# Patient Record
Sex: Male | Born: 1935 | Race: White | Hispanic: No | Marital: Married | Smoking: Former smoker
Health system: Southern US, Community
[De-identification: ages and names within clinical notes are randomized; demographics above are authoritative.]

## PROBLEM LIST (undated history)

## (undated) DIAGNOSIS — C3490 Malignant neoplasm of unspecified part of unspecified bronchus or lung: Secondary | ICD-10-CM

## (undated) DIAGNOSIS — E119 Type 2 diabetes mellitus without complications: Secondary | ICD-10-CM

## (undated) DIAGNOSIS — I10 Essential (primary) hypertension: Secondary | ICD-10-CM

## (undated) HISTORY — PX: LUNG LOBECTOMY: SHX167

---

## 2005-10-28 ENCOUNTER — Ambulatory Visit: Payer: Self-pay | Admitting: Gastroenterology

## 2008-12-20 ENCOUNTER — Ambulatory Visit: Payer: Self-pay | Admitting: Oncology

## 2009-01-15 ENCOUNTER — Ambulatory Visit: Payer: Self-pay | Admitting: Internal Medicine

## 2009-01-18 ENCOUNTER — Ambulatory Visit: Payer: Self-pay | Admitting: Oncology

## 2009-01-20 ENCOUNTER — Ambulatory Visit: Payer: Self-pay | Admitting: Oncology

## 2009-01-23 ENCOUNTER — Ambulatory Visit: Payer: Self-pay | Admitting: Oncology

## 2009-01-24 ENCOUNTER — Ambulatory Visit: Payer: Self-pay | Admitting: Oncology

## 2009-01-29 ENCOUNTER — Ambulatory Visit: Payer: Self-pay | Admitting: General Surgery

## 2009-01-31 ENCOUNTER — Inpatient Hospital Stay: Payer: Self-pay | Admitting: General Surgery

## 2009-02-16 ENCOUNTER — Ambulatory Visit: Payer: Self-pay | Admitting: General Surgery

## 2009-02-20 ENCOUNTER — Ambulatory Visit: Payer: Self-pay | Admitting: Oncology

## 2009-03-20 ENCOUNTER — Ambulatory Visit: Payer: Self-pay | Admitting: Oncology

## 2009-03-26 ENCOUNTER — Ambulatory Visit: Payer: Self-pay | Admitting: General Surgery

## 2009-04-20 ENCOUNTER — Ambulatory Visit: Payer: Self-pay | Admitting: Oncology

## 2009-04-24 ENCOUNTER — Ambulatory Visit: Payer: Self-pay | Admitting: Oncology

## 2009-04-26 ENCOUNTER — Ambulatory Visit: Payer: Self-pay | Admitting: Oncology

## 2009-05-20 ENCOUNTER — Ambulatory Visit: Payer: Self-pay | Admitting: Oncology

## 2009-08-20 ENCOUNTER — Ambulatory Visit: Payer: Self-pay | Admitting: Oncology

## 2009-08-27 ENCOUNTER — Ambulatory Visit: Payer: Self-pay | Admitting: Oncology

## 2009-08-29 ENCOUNTER — Ambulatory Visit: Payer: Self-pay | Admitting: Oncology

## 2009-09-20 ENCOUNTER — Ambulatory Visit: Payer: Self-pay | Admitting: Oncology

## 2009-12-24 ENCOUNTER — Ambulatory Visit: Payer: Self-pay | Admitting: Oncology

## 2009-12-26 ENCOUNTER — Ambulatory Visit: Payer: Self-pay | Admitting: Oncology

## 2010-01-20 ENCOUNTER — Ambulatory Visit: Payer: Self-pay | Admitting: Oncology

## 2010-06-25 ENCOUNTER — Ambulatory Visit: Payer: Self-pay | Admitting: Oncology

## 2010-06-27 ENCOUNTER — Ambulatory Visit: Payer: Self-pay | Admitting: Oncology

## 2010-07-21 ENCOUNTER — Ambulatory Visit: Payer: Self-pay | Admitting: Oncology

## 2011-01-03 IMAGING — CT CT CHEST W/ CM
1 series · 15 of 32 positions shown, 19 images · IV contrast (agent unspecified)
Comparison: 01/15/2009, 04/24/2009, 08/27/2009

REASON FOR EXAM: restaging lung CA
COMMENTS:

PROCEDURE:     CT  - CT CHEST WITH CONTRAST  - December 24, 2009  [DATE]
RESULT:     Indication: Restaging lung cancer
TECHNIQUE: Multiple axial images of the chest are obtained with 75 mL of
Gsovue-190 intravenous contrast.

[Series 2: soft tissue · axial · 0.71mm/px · z∈[-606,-326]mm · 15 of 64 slices shown, 19 images]
[im 5/64  soft-tissue]
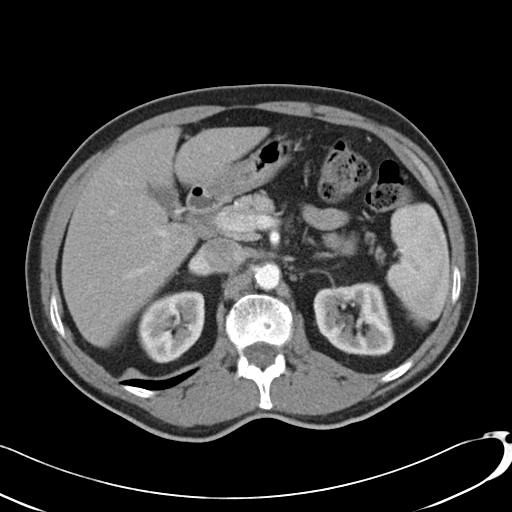
[im 5/64  bone]
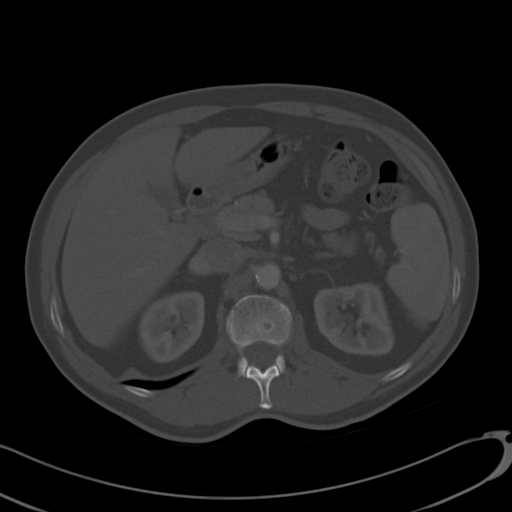
[im 9/64  soft-tissue]
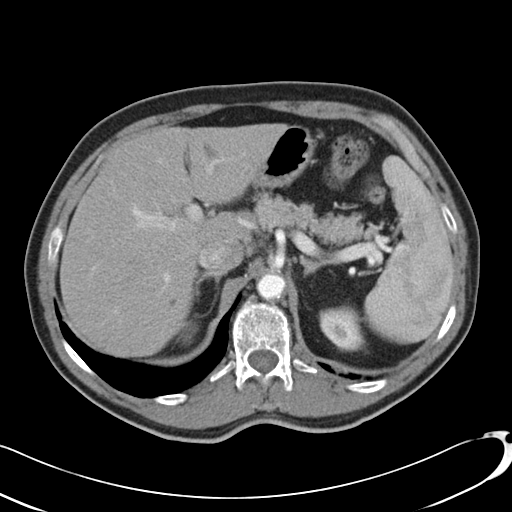
[im 13/64  soft-tissue]
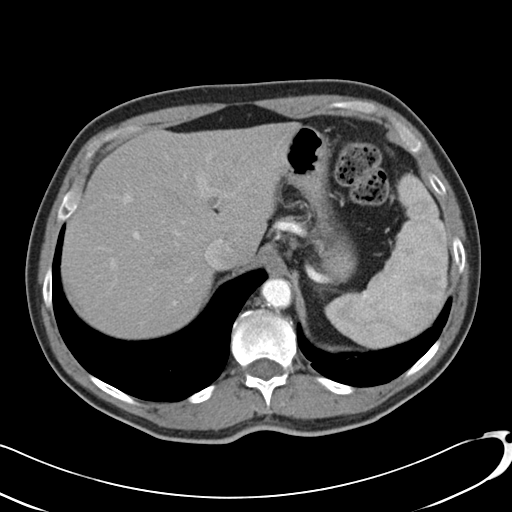
[im 19/64  soft-tissue]
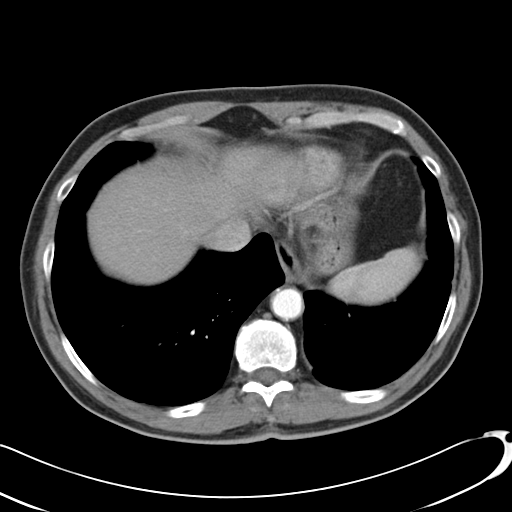
[im 23/64  soft-tissue]
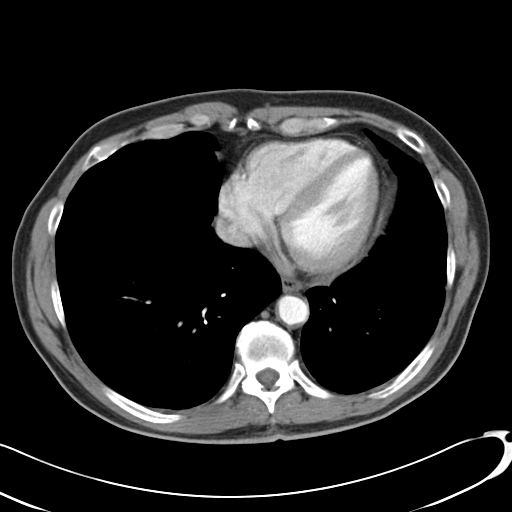
[im 27/64  soft-tissue]
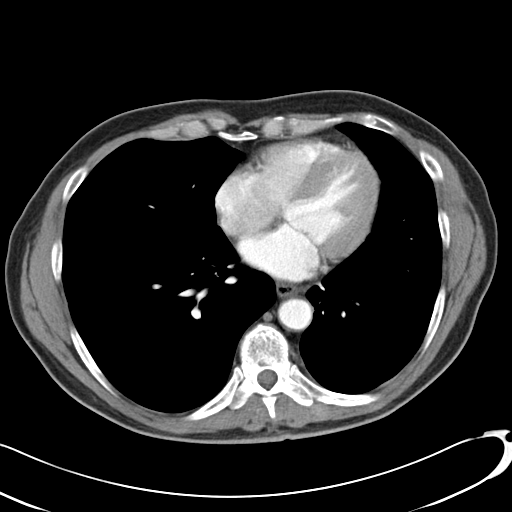
[im 33/64  soft-tissue]
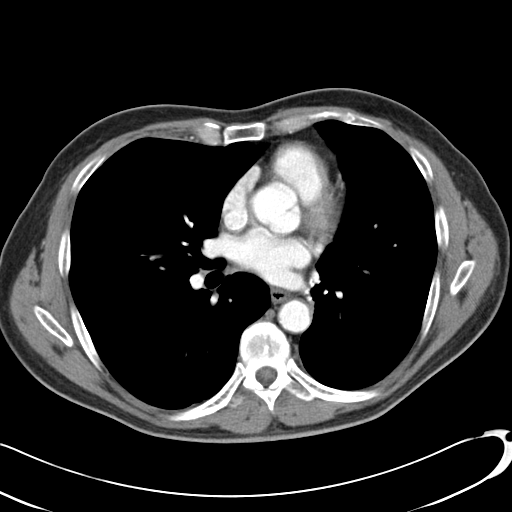
[im 37/64  soft-tissue]
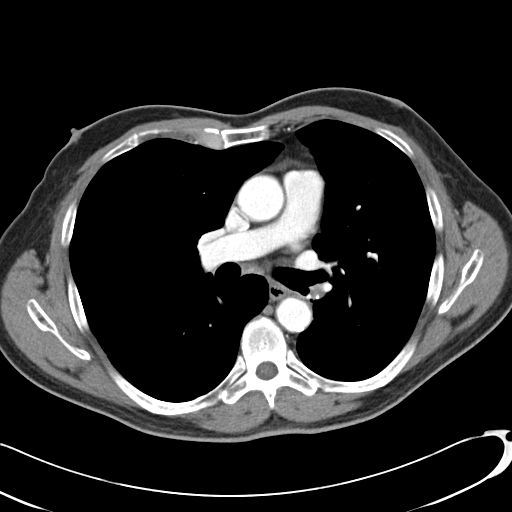
[im 41/64  soft-tissue]
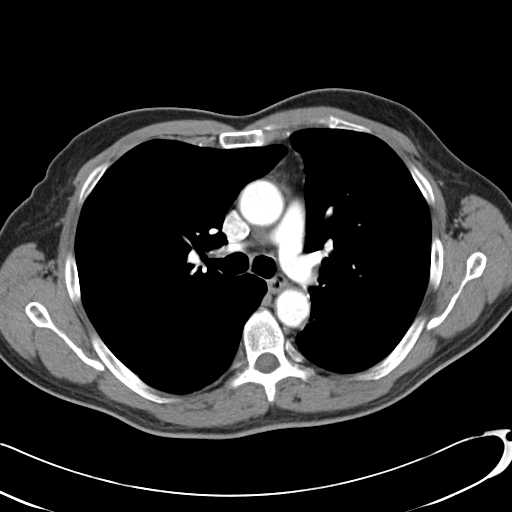
[im 41/64  bone]
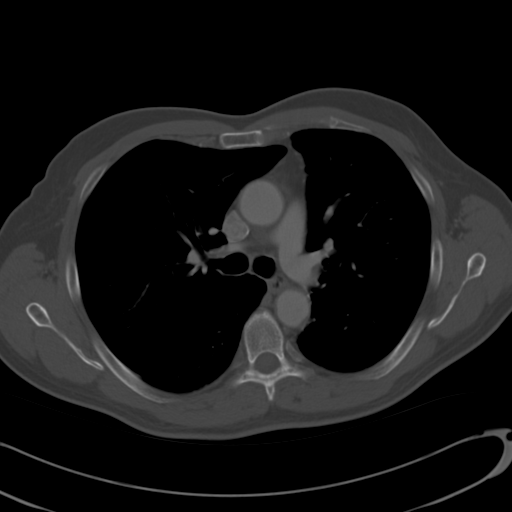
[im 45/64  soft-tissue]
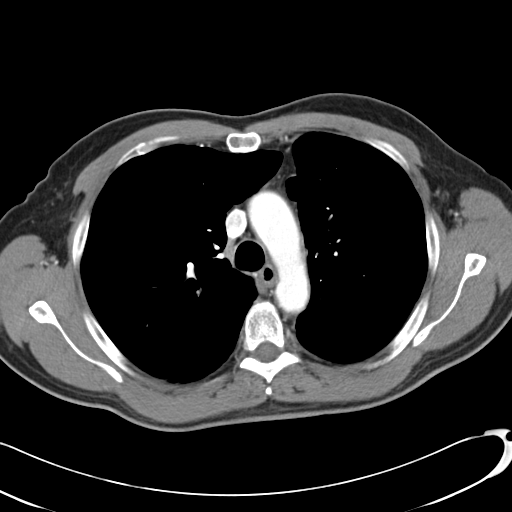
[im 51/64  soft-tissue]
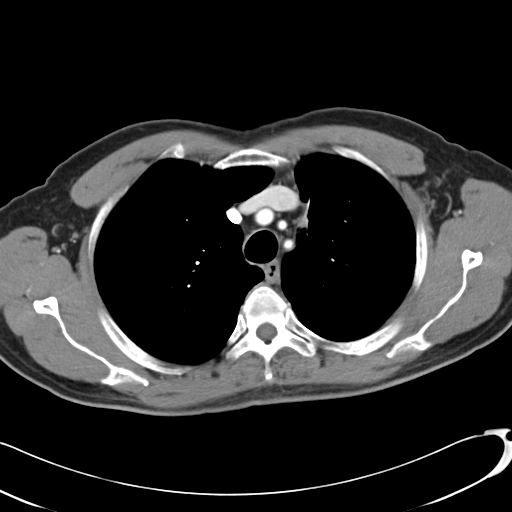
[im 55/64  soft-tissue]
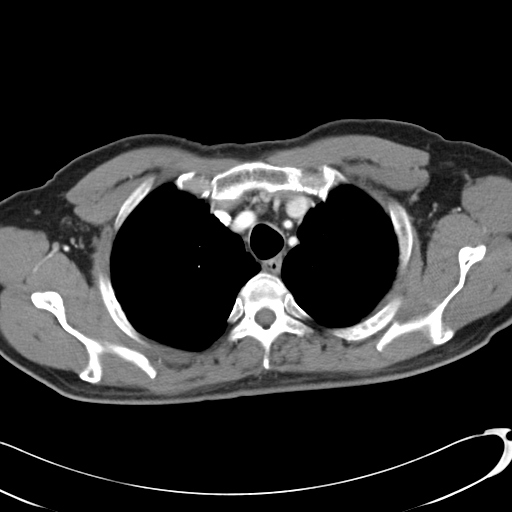
[im 55/64  lung]
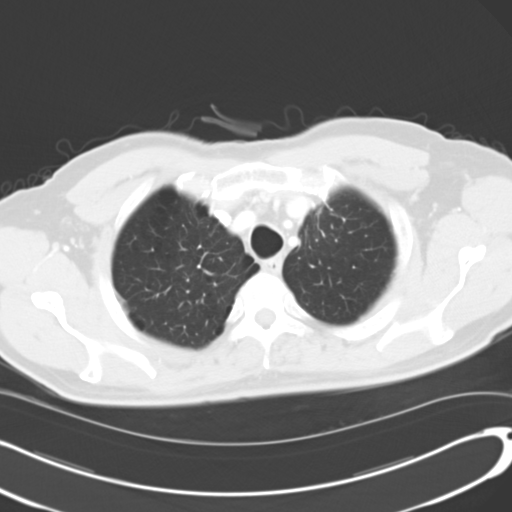
[im 57/64  lung]
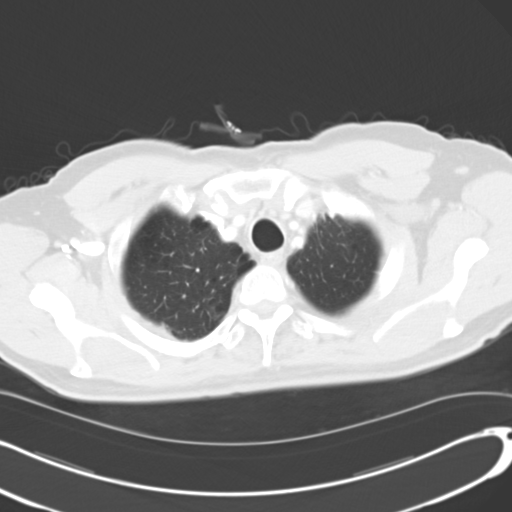
[im 59/64  soft-tissue]
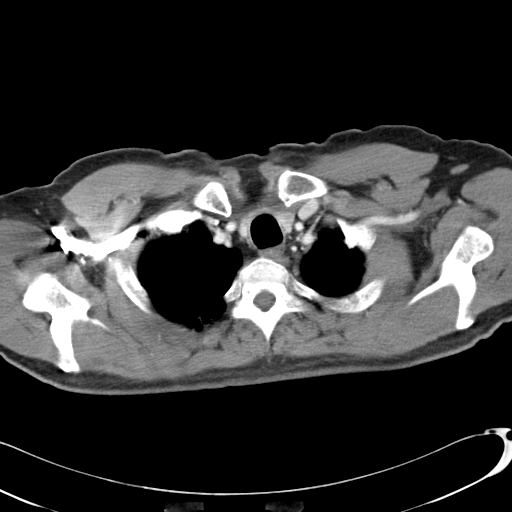
[im 59/64  lung]
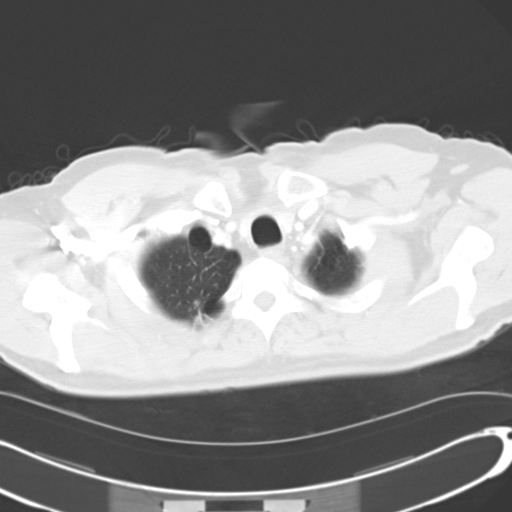
[im 61/64  lung]
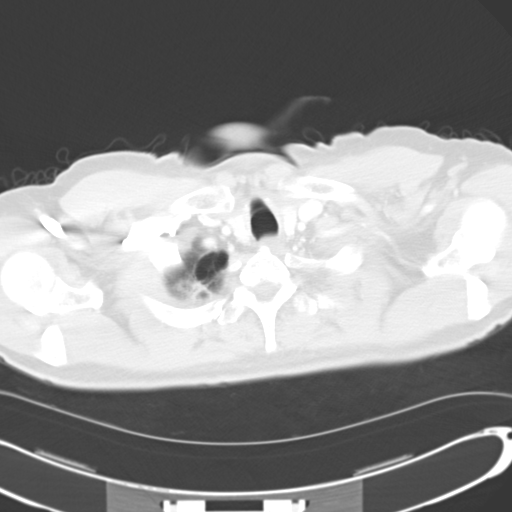

[15 of 32 positions shown; findings below may reference images not displayed]

FINDINGS: The central airways are patent. There are post surgical changes in the upper
medial left lobe. The previously demonstrated mass in the left lower lobe it
is no longer appreciated. There is no new pulmonary nodule or pulmonary
mass. There is no pleural effusion or pneumothorax.

There are no pathologically enlarged axillary, hilar, or mediastinal lymph
nodes.

The heart size is normal. There is no pericardial effusion. The thoracic
aorta is normal in caliber.

Review of bone windows demonstrates no focal lytic or sclerotic lesions.

Limited noncontrast images of the upper abdomen were obtained. The adrenal
glands appear normal.  There multiple hepatic hypodensities which are
incompletely characterize but unchanged compared with 04/24/2009..
IMPRESSION: 1. No evidence of recurrent or residual malignancy.

## 2011-01-23 ENCOUNTER — Ambulatory Visit: Payer: Self-pay | Admitting: Oncology

## 2011-01-27 ENCOUNTER — Ambulatory Visit: Payer: Self-pay | Admitting: Oncology

## 2011-01-30 ENCOUNTER — Ambulatory Visit: Payer: Self-pay | Admitting: Oncology

## 2011-01-31 LAB — CREATININE, SERUM
Creatinine: 0.96 mg/dL (ref 0.60–1.30)
EGFR (African American): >60
EGFR (Non-African Amer.): >60

## 2011-02-21 ENCOUNTER — Ambulatory Visit: Payer: Self-pay | Admitting: Oncology

## 2013-12-24 ENCOUNTER — Inpatient Hospital Stay: Payer: Self-pay | Admitting: Internal Medicine

## 2013-12-24 LAB — COMPREHENSIVE METABOLIC PANEL
ALK PHOS: 91 U/L
Albumin: 3.5 g/dL (ref 3.4–5.0)
Anion Gap: 10 (ref 7–16)
BUN: 16 mg/dL (ref 7–18)
Bilirubin,Total: 0.9 mg/dL (ref 0.2–1.0)
CO2: 28 mmol/L (ref 21–32)
CREATININE: 1.12 mg/dL (ref 0.60–1.30)
Calcium, Total: 8.6 mg/dL (ref 8.5–10.1)
Chloride: 100 mmol/L (ref 98–107)
EGFR (African American): >60
Glucose: 194 mg/dL — ABNORMAL HIGH (ref 65–99)
Osmolality: 282 (ref 275–301)
POTASSIUM: 4 mmol/L (ref 3.5–5.1)
SGOT(AST): 15 U/L (ref 15–37)
SGPT (ALT): 18 U/L
Sodium: 138 mmol/L (ref 136–145)
TOTAL PROTEIN: 6.8 g/dL (ref 6.4–8.2)

## 2013-12-24 LAB — CBC
HCT: 39.2 % — ABNORMAL LOW (ref 40.0–52.0)
HGB: 12.3 g/dL — AB (ref 13.0–18.0)
MCH: 27.2 pg (ref 26.0–34.0)
MCHC: 31.4 g/dL — ABNORMAL LOW (ref 32.0–36.0)
MCV: 86 fL (ref 80–100)
Platelet: 270 10*3/uL (ref 150–440)
RBC: 4.54 10*6/uL (ref 4.40–5.90)
RDW: 15 % — ABNORMAL HIGH (ref 11.5–14.5)
WBC: 25.1 10*3/uL — ABNORMAL HIGH (ref 3.8–10.6)

## 2013-12-24 LAB — URINALYSIS, COMPLETE
BILIRUBIN, UR: NEGATIVE
BLOOD: NEGATIVE
Glucose,UR: NEGATIVE mg/dL (ref 0–75)
Ketone: NEGATIVE
Leukocyte Esterase: NEGATIVE
Nitrite: NEGATIVE
Ph: 5 (ref 4.5–8.0)
Protein: 30
RBC,UR: NONE SEEN /HPF (ref 0–5)
SPECIFIC GRAVITY: 1.019 (ref 1.003–1.030)
Squamous Epithelial: NONE SEEN
WBC UR: 4 /HPF (ref 0–5)

## 2013-12-24 LAB — D-DIMER(ARMC): D-DIMER: 2328 ng/mL

## 2013-12-24 LAB — TROPONIN I: Troponin-I: <0.02 ng/mL

## 2013-12-25 LAB — CBC WITH DIFFERENTIAL/PLATELET
BASOS ABS: 0 10*3/uL (ref 0.0–0.1)
BASOS PCT: 0.1 %
Eosinophil #: 0 10*3/uL (ref 0.0–0.7)
Eosinophil %: 0.1 %
HCT: 31 % — AB (ref 40.0–52.0)
HGB: 9.8 g/dL — ABNORMAL LOW (ref 13.0–18.0)
LYMPHS PCT: 5.1 %
Lymphocyte #: 0.8 10*3/uL — ABNORMAL LOW (ref 1.0–3.6)
MCH: 27.5 pg (ref 26.0–34.0)
MCHC: 31.7 g/dL — ABNORMAL LOW (ref 32.0–36.0)
MCV: 87 fL (ref 80–100)
MONOS PCT: 5.5 %
Monocyte #: 0.8 x10 3/mm (ref 0.2–1.0)
NEUTROS ABS: 13.4 10*3/uL — AB (ref 1.4–6.5)
Neutrophil %: 89.2 %
Platelet: 192 10*3/uL (ref 150–440)
RBC: 3.56 10*6/uL — ABNORMAL LOW (ref 4.40–5.90)
RDW: 15.1 % — ABNORMAL HIGH (ref 11.5–14.5)
WBC: 15 10*3/uL — ABNORMAL HIGH (ref 3.8–10.6)

## 2013-12-25 LAB — BASIC METABOLIC PANEL
ANION GAP: 9 (ref 7–16)
BUN: 13 mg/dL (ref 7–18)
CALCIUM: 7.6 mg/dL — AB (ref 8.5–10.1)
CO2: 26 mmol/L (ref 21–32)
Chloride: 108 mmol/L — ABNORMAL HIGH (ref 98–107)
Creatinine: 0.75 mg/dL (ref 0.60–1.30)
EGFR (Non-African Amer.): >60
Glucose: 47 mg/dL — ABNORMAL LOW (ref 65–99)
OSMOLALITY: 282 (ref 275–301)
Potassium: 3.3 mmol/L — ABNORMAL LOW (ref 3.5–5.1)
Sodium: 143 mmol/L (ref 136–145)

## 2013-12-26 LAB — CBC WITH DIFFERENTIAL/PLATELET
Basophil #: 0 10*3/uL (ref 0.0–0.1)
Basophil %: 0.2 %
Eosinophil #: 0.1 10*3/uL (ref 0.0–0.7)
Eosinophil %: 1.5 %
HCT: 29.1 % — AB (ref 40.0–52.0)
HGB: 9.5 g/dL — ABNORMAL LOW (ref 13.0–18.0)
LYMPHS ABS: 0.5 10*3/uL — AB (ref 1.0–3.6)
Lymphocyte %: 5 %
MCH: 28 pg (ref 26.0–34.0)
MCHC: 32.4 g/dL (ref 32.0–36.0)
MCV: 86 fL (ref 80–100)
Monocyte #: 0.6 x10 3/mm (ref 0.2–1.0)
Monocyte %: 5.8 %
Neutrophil #: 8.7 10*3/uL — ABNORMAL HIGH (ref 1.4–6.5)
Neutrophil %: 87.5 %
Platelet: 200 10*3/uL (ref 150–440)
RBC: 3.37 10*6/uL — ABNORMAL LOW (ref 4.40–5.90)
RDW: 15.1 % — ABNORMAL HIGH (ref 11.5–14.5)
WBC: 9.9 10*3/uL (ref 3.8–10.6)

## 2013-12-26 LAB — BASIC METABOLIC PANEL
ANION GAP: 9 (ref 7–16)
BUN: 12 mg/dL (ref 7–18)
Calcium, Total: 7.9 mg/dL — ABNORMAL LOW (ref 8.5–10.1)
Chloride: 110 mmol/L — ABNORMAL HIGH (ref 98–107)
Co2: 25 mmol/L (ref 21–32)
Creatinine: 0.75 mg/dL (ref 0.60–1.30)
EGFR (African American): >60
EGFR (Non-African Amer.): >60
GLUCOSE: 177 mg/dL — AB (ref 65–99)
OSMOLALITY: 291 (ref 275–301)
Potassium: 3.6 mmol/L (ref 3.5–5.1)
SODIUM: 144 mmol/L (ref 136–145)

## 2013-12-27 LAB — CBC WITH DIFFERENTIAL/PLATELET
BASOS ABS: 0 10*3/uL (ref 0.0–0.1)
Basophil %: 0.3 %
Eosinophil #: 0.3 10*3/uL (ref 0.0–0.7)
Eosinophil %: 3.4 %
HCT: 32.4 % — AB (ref 40.0–52.0)
HGB: 10.6 g/dL — AB (ref 13.0–18.0)
LYMPHS ABS: 0.6 10*3/uL — AB (ref 1.0–3.6)
Lymphocyte %: 7.5 %
MCH: 27.9 pg (ref 26.0–34.0)
MCHC: 32.6 g/dL (ref 32.0–36.0)
MCV: 86 fL (ref 80–100)
Monocyte #: 0.6 x10 3/mm (ref 0.2–1.0)
Monocyte %: 7.3 %
NEUTROS ABS: 6.1 10*3/uL (ref 1.4–6.5)
Neutrophil %: 81.5 %
Platelet: 244 10*3/uL (ref 150–440)
RBC: 3.79 10*6/uL — ABNORMAL LOW (ref 4.40–5.90)
RDW: 15.1 % — ABNORMAL HIGH (ref 11.5–14.5)
WBC: 7.5 10*3/uL (ref 3.8–10.6)

## 2013-12-27 LAB — BASIC METABOLIC PANEL
ANION GAP: 7 (ref 7–16)
BUN: 10 mg/dL (ref 7–18)
CREATININE: 0.71 mg/dL (ref 0.60–1.30)
Calcium, Total: 8.5 mg/dL (ref 8.5–10.1)
Chloride: 112 mmol/L — ABNORMAL HIGH (ref 98–107)
Co2: 25 mmol/L (ref 21–32)
EGFR (African American): >60
EGFR (Non-African Amer.): >60
Glucose: 120 mg/dL — ABNORMAL HIGH (ref 65–99)
Osmolality: 287 (ref 275–301)
Potassium: 3.9 mmol/L (ref 3.5–5.1)
SODIUM: 144 mmol/L (ref 136–145)

## 2013-12-29 LAB — CULTURE, BLOOD (SINGLE)

## 2014-05-13 NOTE — H&P (Signed)
PATIENT NAME:  Justin Wade, Justin Wade MR#:  [id number] DATE OF BIRTH:  [birth year]  DATE OF ADMISSION:  12/24/2013  PRIMARY CARE PHYSICIAN: Leonie Douglas. Doy Hutching, MD  ONCOLOGIST: Gaspar Cola   CHIEF COMPLAINT: Dizziness and weakness.   HISTORY OF PRESENT ILLNESS: This is a 81 year old male who presents to the hospital complaining of dizziness and weakness that began last night when he attempted to get up in the middle of night. The patient says that he has also been feeling increasingly weak now for the past few days. He has a history of lung cancer, but has not had any chemotherapy in the past 3-4 weeks. He presented to the hospital as he was feeling quite weak and dizzy. He was noted to have a leukocytosis. The patient was also noted to be tachycardic in the 120s-130s. He underwent a CT scan of his chest, which did not show any evidence of pulmonary embolism, but did show left lower lobe pneumonia. The patient did have leukocytosis.   Hospitalist services were contacted for further treatment and evaluation.   The patient does admit to a cough, which is productive with yellow sputum, but no hemoptysis. He denies any shortness of breath, any nausea, any vomiting, any abdominal pain. He does admit to occasional diarrhea, but no other associated symptoms presently.   REVIEW OF SYSTEMS:  CONSTITUTIONAL: No documented fever. Positive fatigue and weakness. No weight gain or weight loss.  EYES: No blurred or double vision.  EARS, NOSE, AND THROAT No tinnitus or postnasal drip. No redness of the oropharynx.  RESPIRATORY: Positive cough. No wheeze or hemoptysis. Positive COPD.  CARDIOVASCULAR: No chest pain. No orthopnea. No palpitation or syncope.  GASTROINTESTINAL: No nausea, no vomiting, no diarrhea. No abdominal pain. No melena or hematochezia.  GENITOURINARY: No dysuria or hematuria.  ENDOCRINE: No polyuria or nocturia. No heat or cold intolerance. HEMATOLOGIC: No anemia, no bruising, no bleeding.   INTEGUMENTARY: No rashes or lesions.  MUSCULOSKELETAL: No arthritis. No swelling. No gout.  NEUROLOGIC: No numbness or tingling. No ataxia. No seizure activity.  PSYCHIATRIC: No anxiety or insomnia. No ADD.   PAST MEDICAL HISTORY: Consistent with diabetes, hypertension, history of lung cancer, GERD, hyperlipidemia.   ALLERGIES: No known drug allergies.   SOCIAL HISTORY: Used to be a smoker, quit 30+ years ago. Occasional alcohol use. No illicit drug abuse. Lives with his wife.   FAMILY HISTORY: The patient's father died from complications of diabetes. Mother died from unknown type of cancer.   CURRENT MEDICATIONS: As follows: Atorvastatin 20 mg at bedtime, Tussionex 5 mL at bedtime, dexamethasone 1 tablet b.i.d. on the days before chemotherapy, glimepiride 4 mg b.i.d., hydrocortisone 2.5 mg daily, Imdur 60 mg daily, Janumet 500/50 mg 1 tablet b.i.d., Mucinex daily as needed, multivitamin daily, and omeprazole 40 mg daily.   PHYSICAL EXAMINATION: Presently is as follows:  VITAL SIGNS: Temperature 98.6, pulse 132, respirations 24, blood pressure 100/71, saturations 96% on room air.  GENERAL: The patient is a pleasant-appearing male in no apparent distress.  HEAD, EYES, EARS, NOSE AND THROAT: Atraumatic, normocephalic. Extraocular muscles are intact. Pupils react to light. Sclerae anicteric. No conjunctival injection. No pharyngeal erythema.  NECK: Supple. There is no jugular venous distention. No bruits, no lymphadenopathy, no thyromegaly.  HEART: Regular rate and rhythm, tachycardic. No murmurs, no rubs, no clicks.  LUNGS: Clear to auscultation bilaterally. No rales, no rhonchi, no wheezes.  ABDOMEN: Soft, flat, nontender, nondistended. Has good bowel sounds. No hepatosplenomegaly appreciated.  EXTREMITIES: No evidence of any  cyanosis, clubbing, or peripheral edema. Has +2 pedal and radial pulses bilaterally.  NEUROLOGICAL: The patient is alert and oriented x 3 with no focal motor or sensory  deficits appreciated bilaterally.  SKIN: Moist and warm, with no rashes appreciated.  LYMPHATIC: There is no cervical or axillary lymphadenopathy.   LABORATORY DATA: Serum glucose of 194, BUN 16, creatinine 1.12, sodium 138, potassium 4, chloride 100, bicarbonate 28. The patient's LFTs are within normal limits. Troponin less than 0.02. White cell count 25.1, hemoglobin 12.3, hematocrit 39.2, platelet count 270,000. The patient's CT head showed no evidence of any acute intracranial process. CT angiography of the chest showing patchy reticular nodular airspace opacities in the left lung base with appearance and configuration most typical for a bronchopneumonia. Adjacent to the area is a masslike left lower lobe lung opacity which could represent round atelectasis, but would be best compared to an outside interval restaging exam to determine whether tumor recurrence could appear similar.   ASSESSMENT AND PLAN: This is a 81 year old male with history of diabetes, hypertension, history of lung cancer and GERD, who presents to the hospital with weakness, dizziness, and noted to be tachycardic and also noted to have CT chest findings suggestive of pneumonia.  1.  Pneumonia. This is likely the cause of the patient's weakness and dizziness, and his tachycardia and leukocytosis. This is likely a left lower lobe pneumonia, likely community-acquired pneumonia. I will treat the patient with IV Levaquin. Follow blood and sputum cultures.  2.  Diabetes. Continue metformin, Januvia, and glimepiride and a carbohydrate-controlled diet.  3.  Leukocytosis. Suspect secondary to pneumonia. Will treat with IV antibiotics. Follow white cell count.  4.  Gastroesophageal reflux disease. Continue Protonix.  5.  Hypertension, presently hemodynamically stable. Continue Imdur.  6.  Hyperlipidemia. Continue atorvastatin.   CODE STATUS: The patient is a Full Code.   TIME SPENT: 50 minutes.    ____________________________ Belia Heman. Verdell Carmine, MD vjs:MT D: 12/24/2013 14:51:23 ET T: 12/24/2013 15:10:54 ET JOB#: [id number]  cc: Belia Heman. Verdell Carmine, MD, <Dictator> Henreitta Leber MD ELECTRONICALLY SIGNED 12/24/2013 15:59

## 2014-05-13 NOTE — Discharge Summary (Signed)
PATIENT NAME:  Justin Wade, Justin Wade MR#:  [id number] DATE OF BIRTH:  08-31-1932  DATE OF ADMISSION:  12/24/2013 DATE OF DISCHARGE:  12/27/2013  REASON FOR ADMISSION: Dizziness and weakness.   HISTORY OF PRESENT ILLNESS: Please see the dictated HPI done by Dr. Verdell Carmine on 12/24/2013.   PAST MEDICAL HISTORY: 1.  COPD.  2.  Lung cancer.  3.  Benign hypertension.  4.  Type 2 diabetes.  5.  GE reflux disease.  6.  Hyperlipidemia.   MEDICATIONS ON ADMISSION: Please see admission note.   ALLERGIES: No known drug allergies.   Social history, family history and review of systems, as per admission note.   PHYSICAL EXAMINATION: GENERAL: The patient was in no acute distress.  VITAL SIGNS: Initially remarkable for a blood pressure of 100/71 with a heart rate of 131, respiratory rate of 24, temperature of 98.6, sat 96% on room air.  HEENT: Unremarkable.  NECK: Supple without JVD.  LUNGS: Essentially clear.  CARDIAC: Exam reveals a rapid rate with a regular rhythm. Normal S1, S2.  ABDOMEN: Soft and nontender.  EXTREMITIES: Without edema.  NEUROLOGIC: Grossly nonfocal.   LABORATORY DATA: CT of the chest revealed pneumonia with underlying COPD.   HOSPITAL COURSE: The patient was admitted with pneumonia with underlying lung cancer and tachycardia. He was placed on IV fluids and IV antibiotics. Blood cultures were negative. The patient improved symptomatically. He remained stable on room air. His leukocytosis improved as well. He was switched to oral antibiotics and continued to do well. He was seen by physical therapy who recommended discharge home. He was subsequently discharged home on 12/27/2013 in stable condition.   DISCHARGE DIAGNOSES: 1.  Community-acquired pneumonia.  2.  Lung cancer.  3.  Chronic obstructive pulmonary disease.  4.  Type 2 diabetes.  5.  Tachycardia, resolved.   DISCHARGE MEDICATIONS: 1.  Janumet 50/500, one p.o. b.i.d.  2.  Norvasc 2.5 mg p.o. daily. 3.  Omeprazole  40 mg p.o. daily.  4.  Lipitor 20 mg p.o. at bedtime.  5.  Glimepiride 2 mg p.o. b.i.d.  6.  Tussionex 1 tsp every 12 hours as needed for cough.  7.  Levaquin 500 mg p.o. daily x 10 days.  8.  Colace 100 mg p.o. b.i.d.   FOLLOWUP PLANS AND APPOINTMENTS: The patient was discharged on an 1800 calorie ADA diet. He will follow up with me within 1 week's time, sooner if needed.    ____________________________ Leonie Douglas Doy Hutching, MD jds:DT D: 12/31/2013 10:51:00 ET T: 12/31/2013 14:29:15 ET JOB#: [id number]  cc: Leonie Douglas. Doy Hutching, MD, <Dictator> Audelia Knape Lennice Sites MD ELECTRONICALLY SIGNED 01/01/2014 11:13

## 2016-09-01 ENCOUNTER — Encounter: Payer: Self-pay | Admitting: Emergency Medicine

## 2016-09-01 ENCOUNTER — Emergency Department: Payer: Medicare Other

## 2016-09-01 ENCOUNTER — Inpatient Hospital Stay
Admission: EM | Admit: 2016-09-01 | Discharge: 2016-09-05 | DRG: 191 | Disposition: A | Discharge status: Home Health | Payer: Medicare Other | Attending: Internal Medicine | Admitting: Internal Medicine

## 2016-09-01 DIAGNOSIS — J9601 Acute respiratory failure with hypoxia: Secondary | ICD-10-CM | POA: Diagnosis present

## 2016-09-01 DIAGNOSIS — Y92230 Patient room in hospital as the place of occurrence of the external cause: Secondary | ICD-10-CM | POA: Diagnosis present

## 2016-09-01 DIAGNOSIS — Z515 Encounter for palliative care: Secondary | ICD-10-CM | POA: Diagnosis not present

## 2016-09-01 DIAGNOSIS — Z902 Acquired absence of lung [part of]: Secondary | ICD-10-CM | POA: Diagnosis not present

## 2016-09-01 DIAGNOSIS — C3400 Malignant neoplasm of unspecified main bronchus: Secondary | ICD-10-CM | POA: Diagnosis not present

## 2016-09-01 DIAGNOSIS — Z794 Long term (current) use of insulin: Secondary | ICD-10-CM | POA: Diagnosis not present

## 2016-09-01 DIAGNOSIS — Z7982 Long term (current) use of aspirin: Secondary | ICD-10-CM | POA: Diagnosis not present

## 2016-09-01 DIAGNOSIS — E785 Hyperlipidemia, unspecified: Secondary | ICD-10-CM | POA: Diagnosis present

## 2016-09-01 DIAGNOSIS — R630 Anorexia: Secondary | ICD-10-CM | POA: Diagnosis not present

## 2016-09-01 DIAGNOSIS — R0602 Shortness of breath: Secondary | ICD-10-CM | POA: Diagnosis present

## 2016-09-01 DIAGNOSIS — Z79899 Other long term (current) drug therapy: Secondary | ICD-10-CM | POA: Diagnosis not present

## 2016-09-01 DIAGNOSIS — Z7952 Long term (current) use of systemic steroids: Secondary | ICD-10-CM | POA: Diagnosis not present

## 2016-09-01 DIAGNOSIS — J90 Pleural effusion, not elsewhere classified: Secondary | ICD-10-CM | POA: Diagnosis present

## 2016-09-01 DIAGNOSIS — R0902 Hypoxemia: Secondary | ICD-10-CM

## 2016-09-01 DIAGNOSIS — R000 Tachycardia, unspecified: Secondary | ICD-10-CM | POA: Diagnosis present

## 2016-09-01 DIAGNOSIS — E1165 Type 2 diabetes mellitus with hyperglycemia: Secondary | ICD-10-CM | POA: Diagnosis not present

## 2016-09-01 DIAGNOSIS — J918 Pleural effusion in other conditions classified elsewhere: Secondary | ICD-10-CM | POA: Diagnosis not present

## 2016-09-01 DIAGNOSIS — I10 Essential (primary) hypertension: Secondary | ICD-10-CM | POA: Diagnosis present

## 2016-09-01 DIAGNOSIS — Z9889 Other specified postprocedural states: Secondary | ICD-10-CM

## 2016-09-01 DIAGNOSIS — Z7189 Other specified counseling: Secondary | ICD-10-CM | POA: Diagnosis not present

## 2016-09-01 DIAGNOSIS — T380X5A Adverse effect of glucocorticoids and synthetic analogues, initial encounter: Secondary | ICD-10-CM | POA: Diagnosis present

## 2016-09-01 DIAGNOSIS — R600 Localized edema: Secondary | ICD-10-CM | POA: Diagnosis present

## 2016-09-01 DIAGNOSIS — J439 Emphysema, unspecified: Secondary | ICD-10-CM | POA: Diagnosis present

## 2016-09-01 DIAGNOSIS — Z87891 Personal history of nicotine dependence: Secondary | ICD-10-CM

## 2016-09-01 DIAGNOSIS — R05 Cough: Secondary | ICD-10-CM | POA: Diagnosis not present

## 2016-09-01 DIAGNOSIS — Z9221 Personal history of antineoplastic chemotherapy: Secondary | ICD-10-CM

## 2016-09-01 DIAGNOSIS — E119 Type 2 diabetes mellitus without complications: Secondary | ICD-10-CM | POA: Diagnosis not present

## 2016-09-01 DIAGNOSIS — J984 Other disorders of lung: Secondary | ICD-10-CM | POA: Diagnosis present

## 2016-09-01 DIAGNOSIS — K219 Gastro-esophageal reflux disease without esophagitis: Secondary | ICD-10-CM | POA: Diagnosis present

## 2016-09-01 DIAGNOSIS — Z7984 Long term (current) use of oral hypoglycemic drugs: Secondary | ICD-10-CM

## 2016-09-01 DIAGNOSIS — J189 Pneumonia, unspecified organism: Secondary | ICD-10-CM | POA: Diagnosis present

## 2016-09-01 DIAGNOSIS — Z833 Family history of diabetes mellitus: Secondary | ICD-10-CM | POA: Diagnosis not present

## 2016-09-01 DIAGNOSIS — C3490 Malignant neoplasm of unspecified part of unspecified bronchus or lung: Secondary | ICD-10-CM | POA: Diagnosis not present

## 2016-09-01 DIAGNOSIS — J470 Bronchiectasis with acute lower respiratory infection: Secondary | ICD-10-CM | POA: Diagnosis present

## 2016-09-01 DIAGNOSIS — Z66 Do not resuscitate: Secondary | ICD-10-CM | POA: Diagnosis present

## 2016-09-01 DIAGNOSIS — J441 Chronic obstructive pulmonary disease with (acute) exacerbation: Secondary | ICD-10-CM | POA: Diagnosis not present

## 2016-09-01 DIAGNOSIS — J449 Chronic obstructive pulmonary disease, unspecified: Secondary | ICD-10-CM | POA: Diagnosis not present

## 2016-09-01 DIAGNOSIS — R0600 Dyspnea, unspecified: Secondary | ICD-10-CM

## 2016-09-01 HISTORY — DX: Type 2 diabetes mellitus without complications: E11.9

## 2016-09-01 HISTORY — DX: Malignant neoplasm of unspecified part of unspecified bronchus or lung: C34.90

## 2016-09-01 HISTORY — DX: Essential (primary) hypertension: I10

## 2016-09-01 LAB — BASIC METABOLIC PANEL
ANION GAP: 11 (ref 5–15)
BUN: 24 mg/dL — ABNORMAL HIGH (ref 6–20)
CALCIUM: 9 mg/dL (ref 8.9–10.3)
CO2: 27 mmol/L (ref 22–32)
Chloride: 97 mmol/L — ABNORMAL LOW (ref 101–111)
Creatinine, Ser: 0.86 mg/dL (ref 0.61–1.24)
Glucose, Bld: 546 mg/dL (ref 65–99)
Potassium: 4.7 mmol/L (ref 3.5–5.1)
Sodium: 135 mmol/L (ref 135–145)

## 2016-09-01 LAB — CBC
HCT: 35.1 % — ABNORMAL LOW (ref 40.0–52.0)
HEMOGLOBIN: 11.2 g/dL — AB (ref 13.0–18.0)
MCH: 26.5 pg (ref 26.0–34.0)
MCHC: 31.8 g/dL — ABNORMAL LOW (ref 32.0–36.0)
MCV: 83.3 fL (ref 80.0–100.0)
Platelets: 331 10*3/uL (ref 150–440)
RBC: 4.22 MIL/uL — AB (ref 4.40–5.90)
RDW: 16.7 % — ABNORMAL HIGH (ref 11.5–14.5)
WBC: 18.2 10*3/uL — ABNORMAL HIGH (ref 3.8–10.6)

## 2016-09-01 LAB — GLUCOSE, CAPILLARY
GLUCOSE-CAPILLARY: 284 mg/dL — AB (ref 65–99)
GLUCOSE-CAPILLARY: 331 mg/dL — AB (ref 65–99)
GLUCOSE-CAPILLARY: 368 mg/dL — AB (ref 65–99)
GLUCOSE-CAPILLARY: 406 mg/dL — AB (ref 65–99)

## 2016-09-01 LAB — TROPONIN I

## 2016-09-01 MED ORDER — ATORVASTATIN CALCIUM 20 MG PO TABS
20.0000 mg | ORAL_TABLET | Freq: Every day | ORAL | Status: DC
  Administered 2016-09-01 – 2016-09-04 (×4): 20 mg via ORAL
  Filled 2016-09-01 (×4): qty 1

## 2016-09-01 MED ORDER — GLIMEPIRIDE 2 MG PO TABS
4.0000 mg | ORAL_TABLET | Freq: Two times a day (BID) | ORAL | Status: DC
  Administered 2016-09-01 – 2016-09-05 (×8): 4 mg via ORAL
  Filled 2016-09-01 (×2): qty 4
  Filled 2016-09-01: qty 2
  Filled 2016-09-01: qty 4
  Filled 2016-09-01 (×6): qty 2
  Filled 2016-09-01: qty 4

## 2016-09-01 MED ORDER — LINAGLIPTIN 5 MG PO TABS
5.0000 mg | ORAL_TABLET | Freq: Every day | ORAL | Status: DC
Start: 2016-09-02 — End: 2016-09-05
  Administered 2016-09-02 – 2016-09-05 (×4): 5 mg via ORAL
  Filled 2016-09-01 (×4): qty 1

## 2016-09-01 MED ORDER — INSULIN ASPART 100 UNIT/ML ~~LOC~~ SOLN
8.0000 [IU] | Freq: Once | SUBCUTANEOUS | Status: AC
  Administered 2016-09-01: 8 [IU] via INTRAVENOUS
  Filled 2016-09-01: qty 1

## 2016-09-01 MED ORDER — ACETAMINOPHEN 325 MG PO TABS: 650.0000 mg | ORAL_TABLET | Freq: Four times a day (QID) | ORAL | Status: DC | PRN

## 2016-09-01 MED ORDER — ONDANSETRON HCL 4 MG PO TABS: 4.0000 mg | ORAL_TABLET | Freq: Four times a day (QID) | ORAL | Status: DC | PRN

## 2016-09-01 MED ORDER — MIRTAZAPINE 15 MG PO TABS
15.0000 mg | ORAL_TABLET | Freq: Every day | ORAL | Status: DC
  Administered 2016-09-01 – 2016-09-04 (×3): 15 mg via ORAL
  Filled 2016-09-01 (×4): qty 1

## 2016-09-01 MED ORDER — ASPIRIN EC 81 MG PO TBEC
81.0000 mg | DELAYED_RELEASE_TABLET | Freq: Every day | ORAL | Status: DC
  Administered 2016-09-02 – 2016-09-05 (×4): 81 mg via ORAL
  Filled 2016-09-01 (×5): qty 1

## 2016-09-01 MED ORDER — METFORMIN HCL 500 MG PO TABS
1000.0000 mg | ORAL_TABLET | Freq: Every day | ORAL | Status: DC
  Administered 2016-09-02 – 2016-09-04 (×3): 1000 mg via ORAL
  Filled 2016-09-01 (×3): qty 2

## 2016-09-01 MED ORDER — SITAGLIP PHOS-METFORMIN HCL ER 100-1000 MG PO TB24: 1.0000 | ORAL_TABLET | Freq: Every day | ORAL | Status: DC

## 2016-09-01 MED ORDER — PANTOPRAZOLE SODIUM 40 MG PO TBEC
40.0000 mg | DELAYED_RELEASE_TABLET | Freq: Every day | ORAL | Status: DC
  Administered 2016-09-02 – 2016-09-05 (×4): 40 mg via ORAL
  Filled 2016-09-01 (×4): qty 1

## 2016-09-01 MED ORDER — HYDROCOD POLST-CPM POLST ER 10-8 MG/5ML PO SUER: 5.0000 mL | Freq: Two times a day (BID) | ORAL | Status: DC | PRN

## 2016-09-01 MED ORDER — SODIUM CHLORIDE 0.9 % IV BOLUS (SEPSIS)
1000.0000 mL | Freq: Once | INTRAVENOUS | Status: AC
  Administered 2016-09-01: 1000 mL via INTRAVENOUS

## 2016-09-01 MED ORDER — ENOXAPARIN SODIUM 40 MG/0.4ML ~~LOC~~ SOLN
40.0000 mg | SUBCUTANEOUS | Status: DC
  Administered 2016-09-02 – 2016-09-04 (×3): 40 mg via SUBCUTANEOUS
  Filled 2016-09-01 (×4): qty 0.4

## 2016-09-01 MED ORDER — METOPROLOL SUCCINATE ER 50 MG PO TB24
50.0000 mg | ORAL_TABLET | Freq: Every day | ORAL | Status: DC
  Administered 2016-09-02: 50 mg via ORAL
  Filled 2016-09-01: qty 1

## 2016-09-01 MED ORDER — GUAIFENESIN ER 600 MG PO TB12
600.0000 mg | ORAL_TABLET | Freq: Two times a day (BID) | ORAL | Status: DC | PRN
  Administered 2016-09-04: 600 mg via ORAL
  Filled 2016-09-01: qty 1

## 2016-09-01 MED ORDER — ONDANSETRON HCL 4 MG/2ML IJ SOLN: 4.0000 mg | Freq: Four times a day (QID) | INTRAMUSCULAR | Status: DC | PRN

## 2016-09-01 MED ORDER — ACETAMINOPHEN 650 MG RE SUPP: 650.0000 mg | Freq: Four times a day (QID) | RECTAL | Status: DC | PRN

## 2016-09-01 MED ORDER — FOLIC ACID 1 MG PO TABS
500.0000 ug | ORAL_TABLET | Freq: Every day | ORAL | Status: DC
  Administered 2016-09-02 – 2016-09-05 (×4): 0.5 mg via ORAL
  Filled 2016-09-01 (×4): qty 1

## 2016-09-01 NOTE — H&P (Signed)
[facility] NAME: Justin Wade    MR#:  [id number]  DATE OF BIRTH:  [birth year]  DATE OF ADMISSION:  09/01/2016  PRIMARY CARE PHYSICIAN: Idelle Crouch, MD   REQUESTING/REFERRING PHYSICIAN: Dr. Harvest Dark  CHIEF COMPLAINT:   Chief Complaint  Patient presents with  . Shortness of Breath    HISTORY OF PRESENT ILLNESS:  Justin Wade  is a 83 y.o. male with a known history of Cancer, diabetes, hypertension who presents to the hospital due to shortness of breath. Patient has a history of lung cancer and is extensively followed up at [facility] and his ongoing chemotherapy and his last dose was over a month ago, now presents with shortness of breath even at minimal exertion and also at rest. Patient admits to a cough which is productive with yellow sputum but no fevers, chills, nausea, vomiting, abdominal pain or any other associated symptoms. Since his shortness of breath was progressively getting worse he came to the ER for further evaluation and underwent a chest x-ray which showed a moderately sized right-sided pleural effusion. Hospitalist services were contacted further treatment and evaluation.  PAST MEDICAL HISTORY:   Past Medical History:  Diagnosis Date  . Diabetes mellitus without complication ([place])   . Hypertension   . Lung cancer ([place])     PAST SURGICAL HISTORY:   Past Surgical History:  Procedure Laterality Date  . LUNG LOBECTOMY      SOCIAL HISTORY:   Social History  Substance Use Topics  . Smoking status: Former Smoker    Packs/day: 0.50    Years: 35.00    Types: Cigarettes  . Smokeless tobacco: Never Used  . Alcohol use No    FAMILY HISTORY:   Family History  Problem Relation Age of Onset  . Diabetes Father   . Vascular Disease Father     DRUG ALLERGIES:  No Known Allergies  REVIEW OF SYSTEMS:   Review of Systems  Constitutional: Negative for fever and weight loss.  HENT: Negative  for congestion, nosebleeds and tinnitus.   Eyes: Negative for blurred vision, double vision and redness.  Respiratory: Positive for cough, sputum production and shortness of breath. Negative for hemoptysis.   Cardiovascular: Negative for chest pain, orthopnea, leg swelling and PND.  Gastrointestinal: Negative for abdominal pain, diarrhea, melena, nausea and vomiting.  Genitourinary: Negative for dysuria, hematuria and urgency.  Musculoskeletal: Negative for falls and joint pain.  Neurological: Negative for dizziness, tingling, sensory change, focal weakness, seizures, weakness and headaches.  Endo/Heme/Allergies: Negative for polydipsia. Does not bruise/bleed easily.  Psychiatric/Behavioral: Negative for depression and memory loss. The patient is not nervous/anxious.     MEDICATIONS AT HOME:   Prior to Admission medications   Medication Sig Start Date End Date Taking? Authorizing Provider  aspirin EC 81 MG tablet Take 81 mg by mouth daily.   Yes [provider]  atorvastatin (LIPITOR) 20 MG tablet Take 1 tablet by mouth at bedtime. 03/14/16  Yes [provider]  chlorpheniramine-HYDROcodone (TUSSIONEX) 10-8 MG/5ML SUER Take 5 mLs by mouth every 12 (twelve) hours as needed.  11/29/15  Yes [provider]  dexamethasone (DECADRON) 4 MG tablet Take 4 mg by mouth as needed.   Yes [provider]  folic acid (FOLVITE) 789 MCG tablet Take 400 mcg by mouth daily.    Yes [provider]  glimepiride (AMARYL) 4 MG tablet Take 4 mg by mouth 2 (two) times daily.  03/14/16  Yes  [provider]  guaiFENesin (MUCINEX) 600 MG 12 hr tablet Take 600 mg by mouth as needed.  12/16/11  Yes [provider]  metoprolol succinate (TOPROL-XL) 50 MG 24 hr tablet Take 50 mg by mouth daily.  04/08/16 04/08/17 Yes [provider]  mirtazapine (REMERON) 15 MG tablet Take 15 mg by mouth. 06/09/16 06/09/17 Yes [provider]  omeprazole (PRILOSEC)  40 MG capsule Take 40 mg by mouth 2 (two) times daily.  02/19/16  Yes [provider]  SitaGLIPtin-MetFORMIN HCl (JANUMET XR) [phone] MG TB24 Take 1 tablet by mouth daily.   Yes [provider]      VITAL SIGNS:  Blood pressure 118/71, pulse 78, temperature (!) 97.5 F (36.4 C), temperature source Oral, resp. rate 20, weight 49.4 kg (109 lb), SpO2 100 %.  PHYSICAL EXAMINATION:  Physical Exam  GENERAL:  83 y.o.-year-old patient lying in the bed in mild Resp. Distress.   EYES: Pupils equal, round, reactive to light and accommodation. No scleral icterus. Extraocular muscles intact.  HEENT: Head atraumatic, normocephalic. Oropharynx and nasopharynx clear. No oropharyngeal erythema, moist oral mucosa  NECK:  Supple, no jugular venous distention. No thyroid enlargement, no tenderness.  LUNGS: Good air entry bilaterally, Rales on right mid to lower lung field. Minimal end- exp. Wheezing b/l. No rhonchi. + use of accessory muscles.  CARDIOVASCULAR: S1, S2 RRR. No murmurs, rubs, gallops, clicks.  ABDOMEN: Soft, nontender, nondistended. Bowel sounds present. No organomegaly or mass.  EXTREMITIES: No pedal edema, cyanosis, or clubbing. + 2 pedal & radial pulses b/l.   NEUROLOGIC: Cranial nerves II through XII are intact. No focal Motor or sensory deficits appreciated b/l PSYCHIATRIC: The patient is alert and oriented x 3. SKIN: No obvious rash, lesion, or ulcer.   LABORATORY PANEL:   CBC  Recent Labs Lab 09/01/16 1502  WBC 18.2*  HGB 11.2*  HCT 35.1*  PLT 331   ------------------------------------------------------------------------------------------------------------------  Chemistries   Recent Labs Lab 09/01/16 1502  NA 135  K 4.7  CL 97*  CO2 27  GLUCOSE 546*  BUN 24*  CREATININE 0.86  CALCIUM 9.0   ------------------------------------------------------------------------------------------------------------------  Cardiac Enzymes  Recent Labs Lab  09/01/16 1502  TROPONINI <0.03   ------------------------------------------------------------------------------------------------------------------  RADIOLOGY:  Dg Chest 2 View  Result Date: 09/01/2016 CLINICAL DATA:  Shortness of breath.  History of lung carcinoma. EXAM: CHEST  2 VIEW COMPARISON:  12/26/2013 FINDINGS: Stable positioning of right-sided Port-A-Cath. The heart size and mediastinal contours are within normal limits. New right pleural effusion of small to moderate volume. Left pleural fluid volume smaller than on the prior chest x-ray with relatively small effusion present. Vague irregular opacity in the peripheral aspect of the left upper lung may represent pleural or parenchymal opacity. Underlying chronic lung disease present with scattered areas of parenchymal scarring. No definite pneumonia or pulmonary edema. No pneumothorax identified. The visualized skeletal structures are unremarkable. IMPRESSION: New right pleural effusion of small to moderate volume. Left pleural fluid volume appears smaller compared to a prior chest x-ray in 2015. Irregular opacity in the left lung may be of pleural or parenchymal origin. Cannot exclude an irregular pulmonary nodule in this region. Consider follow-up with CT of the chest unless there are recent outside CT studies of the chest. Electronically Signed   By: Aletta Edouard M.D.   On: 09/01/2016 17:39     IMPRESSION AND PLAN:   83 year old male with past medical history of hypertension, diabetes, lung cancer currently undergoing chemotherapy presents to the  hospital due to shortness of breath.  1. Acute respiratory failure with hypoxia-secondary to pleural effusion is seen on chest x-ray on admission. -continue O2 supplementation for now. Plan for ultrasound-guided thoracentesis for tomorrow. Continue supportive care.  2. Pleural effusion-just cause of patient's shortness of breath. -I suspect this is malignant in nature given his history of  lung cancer. -I will order a ultrasound-guided thoracentesis for tomorrow for diagnostic and therapeutic purposes.  3. Diabetes type 2 without complication-continue metformin, Januvia, glimepiride.  4. Essential hypertension-continue metoprolol.  5. GERD-continue Protonix.  6. Hyperlipidemia-continue atorvastatin.  7. Hx of Lung cancer - currently ongoing chemo. Follows at [facility]   All the records are reviewed and case discussed with ED provider. Management plans discussed with the patient, family and they are in agreement.  CODE STATUS: Full code  TOTAL TIME TAKING CARE OF THIS PATIENT: 45 minutes.    Henreitta Leber M.D on 09/01/2016 at 6:50 PM  Between 7am to 6pm - Pager - [phone]  After 6pm go to [email or web address] - password [email or web address] [email or web address]  Office  [phone]  CC: Primary care physician; Idelle Crouch, MD

## 2016-09-01 NOTE — ED Notes (Signed)
Placed pt on 2 L nasal cannula per Dr. Kerman Passey reccomendation. Will continue to monitor.

## 2016-09-01 NOTE — ED Provider Notes (Signed)
[facility] Emergency Department Provider Note  Time seen: 3:10 PM  I have reviewed the triage vital signs and the nursing notes.   HISTORY  Chief Complaint Shortness of Breath    HPI Justin Wade. is a 83 y.o. male With a past medical history of diabetes, hypertension, lung cancer on chemotherapy who presents the emergency department for shortness of breath and tachycardia. According to the patient for the past 2 months he has been experiencing worsening shortness of breath. He has seen his primary care doctor Dr. Doy Hutching for the same, was placed on steroids. Had a follow-up appointment today and was noted to be hypoxic in the 80s and tachycardic to 120s. Patient was sent to the emergency department for further evaluation. Patient denies any chest pain. States shortness breath has been progressively worsening over 2 months. Denies any history of COPD. Denies any home O2 requirement. Patient states he has been coughing with yellow sputum production. Denies any fever, nausea, vomiting or diarrhea. Denies abdominal pain. Denies any lower extremitpain but states he has been experiencing pedal edema over the past few weeks.  Past Medical History:  Diagnosis Date  . Diabetes mellitus without complication ([place])   . Hypertension   . Lung cancer ([place])     There are no active problems to display for this patient.   Past Surgical History:  Procedure Laterality Date  . LUNG LOBECTOMY      Prior to Admission medications   Not on File    No Known Allergies  No family history on file.  Social History Social History  Substance Use Topics  . Smoking status: Former Research scientist (life sciences)  . Smokeless tobacco: Never Used  . Alcohol use No    Review of Systems Constitutional: Negative for fever. Cardiovascular: Negative for chest pain. Respiratory: positive for shortness of breath Gastrointestinal: Negative for abdominal pain Musculoskeletal: Negative for back  pain.mild pedal edema bilaterally. No calf Pain. Skin: Negative for rash. Neurological: Negative for headache All other ROS negative  ____________________________________________   PHYSICAL EXAM:  VITAL SIGNS: ED Triage Vitals  Enc Vitals Group     BP 09/01/16 1430 (!) 99/56     Pulse Rate 09/01/16 1430 (!) 117     Resp 09/01/16 1430 (!) 22     Temp 09/01/16 1430 (!) 97.5 F (36.4 C)     Temp Source 09/01/16 1430 Oral     SpO2 09/01/16 1430 91 %     Weight 09/01/16 1431 109 lb (49.4 kg)     Height --      Head Circumference --      Peak Flow --      Pain Score --      Pain Loc --      Pain Edu? --      Excl. in [place]? --     Constitutional: Alert and oriented. Well appearing and in no distress. Eyes: Normal exam ENT   Head: Normocephalic and atraumatic.   Mouth/Throat: Mucous membranes are moist. Cardiovascular: Normal rate, regular rhythm Respiratory: mild tachypnea. Diminished breath sounds bilaterally but no obvious wheezes rales or rhonchi. Gastrointestinal: Soft and nontender. No distention Musculoskeletal: Nontender with normal range of motion in all extremities. mild pedal edema bilaterally. No calf tenderness. Neurologic:  Normal speech and language. No gross focal neurologic deficits Skin:  Skin is warm, dry and intact.  Psychiatric: Mood and affect are normal.   ____________________________________________    EKG  EKG reviewed and interpreted by myself shows  sinus tachycardia 121 bpm, narrow QRS, normal axis, normal intervals. Nonspecific ST changes without elevation.  ____________________________________________    RADIOLOGY  on my read of the patient's chest x-ray he appears to have a fairly large right-sided pleural effusion. Official read pending  ____________________________________________   INITIAL IMPRESSION / ASSESSMENT AND PLAN / ED COURSE  Pertinent labs & imaging results that were available during my care of the patient were  reviewed by me and considered in my medical decision making (see chart for details).  he presents to the emergency department with tachypnea, 88% on room air, tachycardic to 120s. Patient has diminished breath sounds bilaterally but no obvious wheezes rales or rhonchi. No known history of COPD but the patient does have a history of lung cancer currently on chemotherapy although he has missed his chemotherapy over the past 2-3 weeks due to generalized fatigue and weakness. We will check labs, chest x-ray, placed on nasal cannula oxygen for comfort and continue to closely monitor.  x-ray appears to show a fairly large right-sided pleural effusion. Given the patient's hypoxia with tachycardia and a right-sided pleural effusion we will admit to the hospital for further management and possible drainage. Patient agreeable plan.  ____________________________________________   FINAL CLINICAL IMPRESSION(S) / ED DIAGNOSES  dyspnea pleural effusion   Harvest Dark, MD 09/01/16 [phone]

## 2016-09-01 NOTE — ED Triage Notes (Signed)
Pt sent over by Dr Doy Hutching for further eval of decreased lung sounds and low oxygen, tachycardia. Pt c/o increased shortness of breath.

## 2016-09-01 NOTE — ED Notes (Signed)
Pt states SOB x 6 weeks, seeing Dr. Doy Hutching today who sent him here. Pt states lung CA, on [place] for it, stopped chemo. Pt states bilat feet swelling x few days. Hx DM. Pt is tachycardiac, tachypeneic. 91% on RA currently, will continue to monitor.

## 2016-09-01 NOTE — ED Notes (Signed)
Pt stated he needed to urinate and wanted to sit on toilet to do so. Pt ambulated well by self, kept oxygen on pt while walking. Pt became tachypnic again, but oxygen levels stayed 95% on 2 L.

## 2016-09-01 NOTE — ED Notes (Signed)
Date and time results received: 09/01/16 1600   Test: Glucose Critical Value: 546  Name of Provider Notified: Dr. Kerman Passey

## 2016-09-02 ENCOUNTER — Inpatient Hospital Stay: Payer: Medicare Other

## 2016-09-02 DIAGNOSIS — C3490 Malignant neoplasm of unspecified part of unspecified bronchus or lung: Secondary | ICD-10-CM

## 2016-09-02 DIAGNOSIS — J90 Pleural effusion, not elsewhere classified: Secondary | ICD-10-CM

## 2016-09-02 DIAGNOSIS — J9601 Acute respiratory failure with hypoxia: Secondary | ICD-10-CM

## 2016-09-02 LAB — BODY FLUID CELL COUNT WITH DIFFERENTIAL
EOS FL: 0 %
Lymphs, Fluid: 82 %
Monocyte-Macrophage-Serous Fluid: 11 %
Neutrophil Count, Fluid: 7 %
OTHER CELLS FL: 0 %
Total Nucleated Cell Count, Fluid: 193 cu mm

## 2016-09-02 LAB — BASIC METABOLIC PANEL
Anion gap: 6 (ref 5–15)
BUN: 19 mg/dL (ref 6–20)
CHLORIDE: 103 mmol/L (ref 101–111)
CO2: 31 mmol/L (ref 22–32)
CREATININE: 0.58 mg/dL — AB (ref 0.61–1.24)
Calcium: 8.6 mg/dL — ABNORMAL LOW (ref 8.9–10.3)
GFR calc non Af Amer: >60 mL/min (ref >60)
Glucose, Bld: 187 mg/dL — ABNORMAL HIGH (ref 65–99)
POTASSIUM: 4.1 mmol/L (ref 3.5–5.1)
SODIUM: 140 mmol/L (ref 135–145)

## 2016-09-02 LAB — CBC
HEMATOCRIT: 29.5 % — AB (ref 40.0–52.0)
Hemoglobin: 9.6 g/dL — ABNORMAL LOW (ref 13.0–18.0)
MCH: 26.6 pg (ref 26.0–34.0)
MCHC: 32.4 g/dL (ref 32.0–36.0)
MCV: 82.1 fL (ref 80.0–100.0)
PLATELETS: 275 10*3/uL (ref 150–440)
RBC: 3.6 MIL/uL — AB (ref 4.40–5.90)
RDW: 16.8 % — ABNORMAL HIGH (ref 11.5–14.5)
WBC: 12.5 10*3/uL — ABNORMAL HIGH (ref 3.8–10.6)

## 2016-09-02 LAB — GLUCOSE, PLEURAL OR PERITONEAL FLUID: Glucose, Fluid: 142 mg/dL

## 2016-09-02 LAB — PROTEIN, PLEURAL OR PERITONEAL FLUID

## 2016-09-02 LAB — LACTATE DEHYDROGENASE, PLEURAL OR PERITONEAL FLUID: LD FL: 192 U/L — AB (ref 3–23)

## 2016-09-02 LAB — GLUCOSE, CAPILLARY
GLUCOSE-CAPILLARY: 103 mg/dL — AB (ref 65–99)
GLUCOSE-CAPILLARY: 272 mg/dL — AB (ref 65–99)
Glucose-Capillary: 344 mg/dL — ABNORMAL HIGH (ref 65–99)
Glucose-Capillary: 342 mg/dL — ABNORMAL HIGH (ref 65–99)

## 2016-09-02 LAB — AMYLASE, PLEURAL OR PERITONEAL FLUID: AMYLASE FL: 64 U/L

## 2016-09-02 MED ORDER — ENSURE ENLIVE PO LIQD
237.0000 mL | Freq: Two times a day (BID) | ORAL | Status: DC
  Administered 2016-09-02 – 2016-09-05 (×5): 237 mL via ORAL

## 2016-09-02 MED ORDER — ALUM & MAG HYDROXIDE-SIMETH 200-200-20 MG/5ML PO SUSP
30.0000 mL | Freq: Once | ORAL | Status: AC
  Administered 2016-09-02: 30 mL via ORAL
  Filled 2016-09-02: qty 30

## 2016-09-02 MED ORDER — SODIUM CHLORIDE 0.9% FLUSH: 10.0000 mL | INTRAVENOUS | Status: DC | PRN

## 2016-09-02 MED ORDER — ADULT MULTIVITAMIN W/MINERALS CH
1.0000 | ORAL_TABLET | Freq: Every day | ORAL | Status: DC
  Administered 2016-09-03 – 2016-09-05 (×3): 1 via ORAL
  Filled 2016-09-02 (×4): qty 1

## 2016-09-02 MED ORDER — IPRATROPIUM-ALBUTEROL 0.5-2.5 (3) MG/3ML IN SOLN
3.0000 mL | Freq: Four times a day (QID) | RESPIRATORY_TRACT | Status: DC
  Administered 2016-09-02 – 2016-09-05 (×10): 3 mL via RESPIRATORY_TRACT
  Filled 2016-09-02 (×10): qty 3

## 2016-09-02 MED ORDER — METHYLPREDNISOLONE SODIUM SUCC 40 MG IJ SOLR
40.0000 mg | Freq: Three times a day (TID) | INTRAMUSCULAR | Status: DC
  Administered 2016-09-02 – 2016-09-03 (×3): 40 mg via INTRAVENOUS
  Filled 2016-09-02 (×3): qty 1

## 2016-09-02 MED ORDER — PIPERACILLIN-TAZOBACTAM 3.375 G IVPB
3.3750 g | Freq: Three times a day (TID) | INTRAVENOUS | Status: DC
  Administered 2016-09-02 – 2016-09-05 (×8): 3.375 g via INTRAVENOUS
  Filled 2016-09-02 (×8): qty 50

## 2016-09-02 MED ORDER — SODIUM CHLORIDE 0.9 % IV BOLUS (SEPSIS)
500.0000 mL | Freq: Once | INTRAVENOUS | Status: AC
  Administered 2016-09-02: 21:00:00 500 mL via INTRAVENOUS

## 2016-09-02 NOTE — Progress Notes (Signed)
NT informed writing RN of BP 96/54 (62). Nurse came to beside and retook BP with NT present. BP still reading low. Pt asymptomatic, sitting up in bed A&O X4, states "they feel fine and no different".  MD called and made aware. No new orders at that time but to keep close watch on BP. BP rechecked about 30 min later. Pressures taken in different areas and side, 2 different BP cuffs used. Bps reading 78/51 (58), 81/49 (56), pt still asymptomatic. MD called made aware, order of 500 mL bolus. Currently administering. Will continue to monitor pt and  BP.

## 2016-09-02 NOTE — Progress Notes (Signed)
ANTIBIOTIC CONSULT NOTE - INITIAL  Pharmacy Consult for Zosyn  Indication: HCAP   No Known Allergies  Patient Measurements: Height: 5\' 7"  (170.2 cm) Weight: 108 lb (49 kg) IBW/kg (Calculated) : 66.1 Adjusted Body Weight:   Vital Signs: Temp: 97.8 F (36.6 C) (08/14 1251) Temp Source: Oral (08/14 1251) BP: 108/58 (08/14 1251) Pulse Rate: 94 (08/14 1251) Intake/Output from previous day: 08/13 0701 - 08/14 0700 In: -  Out: 200 [Urine:200] Intake/Output from this shift: Total I/O In: 240 [P.O.:240] Out: -   Labs:  Recent Labs  09/01/16 1502 09/02/16 0423  WBC 18.2* 12.5*  HGB 11.2* 9.6*  PLT 331 275  CREATININE 0.86 0.58*   Estimated Creatinine Clearance: 50.2 mL/min (A) (by C-G formula based on SCr of 0.58 mg/dL (L)). No results for input(s): VANCOTROUGH, VANCOPEAK, VANCORANDOM, GENTTROUGH, GENTPEAK, GENTRANDOM, TOBRATROUGH, TOBRAPEAK, TOBRARND, AMIKACINPEAK, AMIKACINTROU, AMIKACIN in the last 72 hours.   Microbiology: No results found for this or any previous visit (from the past 720 hour(s)).  Medical History: Past Medical History:  Diagnosis Date  . Diabetes mellitus without complication ([place])   . Hypertension   . Lung cancer ([place])     Medications:  Scheduled:  . aspirin EC  81 mg Oral Daily  . atorvastatin  20 mg Oral QHS  . enoxaparin (LOVENOX) injection  40 mg Subcutaneous Q24H  . feeding supplement (ENSURE ENLIVE)  237 mL Oral BID BM  . folic acid  500 mcg Oral Daily  . glimepiride  4 mg Oral BID  . ipratropium-albuterol  3 mL Nebulization Q6H  . linagliptin  5 mg Oral Daily   And  . metFORMIN  1,000 mg Oral Q breakfast  . methylPREDNISolone (SOLU-MEDROL) injection  40 mg Intravenous Q8H  . metoprolol succinate  50 mg Oral Daily  . mirtazapine  15 mg Oral QHS  . multivitamin with minerals  1 tablet Oral Daily  . pantoprazole  40 mg Oral Daily   Assessment: Pharmacy consulted to dose zosyn in this 83 year old male with HCAP.  CrCl = 50.2  ml/min Culture of pleural effusion has been drawn but not resulted.   Goal of Therapy:  resolution of infection  Plan:  Expected duration 7 days with resolution of temperature and/or normalization of WBC   Zosyn 3.375 gm IV Q8H EI ordered to start on 8/14 @ 16:30.   Jermale Crass D 09/02/2016,4:26 PM

## 2016-09-02 NOTE — Progress Notes (Signed)
Inpatient Diabetes Program Recommendations  AACE/ADA: New Consensus Statement on Inpatient Glycemic Control (2015)  Target Ranges:  Prepandial:   less than 140 mg/dL      Peak postprandial:   less than 180 mg/dL (1-2 hours)      Critically ill patients:  140 - 180 mg/dL   Results for Justin Wade, Justin Wade (MRN [id number]) as of 09/02/2016 09:55  Ref. Range 09/01/2016 21:32 09/01/2016 22:42 09/01/2016 23:33 09/02/2016 00:37 09/02/2016 07:58  Glucose-Capillary Latest Ref Range: 65 - 99 mg/dL 342 (H) 331 (H) 284 (H) 272 (H) 103 (H)    Admit with: SOB/ Pleural Effusion  History: DM, Lung Cancer  Home DM Meds: Amaryl 4 mg BID       Janumet 100/1000 mg daily  Current Insulin Orders: Amaryl 4 mg BID        Metformin 1000 mg daily      Tradjenta 5 mg daily      MD- Please consider placing orders for Novolog Sensitive Correction Scale/ SSI (0-9 units) TID AC + HS      --Will follow patient during hospitalization--  Wyn Quaker RN, MSN, CDE Diabetes Coordinator Inpatient Glycemic Control Team Team Pager: [phone] (8a-5p)

## 2016-09-02 NOTE — Progress Notes (Signed)
SATURATION QUALIFICATIONS: (This note is used to comply with regulatory documentation for home oxygen)  Patient Saturations on Room Air at Rest= 95%  Patient Saturations on Room Air while Ambulating = 84%  Patient Saturations on 2  Liters of oxygen while Ambulating = 95%  Please briefly explain why patient needs home oxygen:

## 2016-09-02 NOTE — Progress Notes (Signed)
Initial Nutrition Assessment  DOCUMENTATION CODES:   Severe malnutrition in context of chronic illness, Underweight  INTERVENTION:  Provide Ensure Enlive po BID, each supplement provides 350 kcal and 20 grams of protein. Patient prefers served with a cup of ice.  Provide daily multivitamin with minerals.  Reviewed "High-Calorie, High-Protein Nutrition Therapy" and "High-Calorie, High-Protein Recipes" handouts from the Academy of Nutrition and Dietetics with patient and wife. Encouraged intake of small, frequent meals with adequate calories and protein. Discussed patient's daily nutrition goals (calories, protein, and fluid) and how to meet these goals through meals, snacks, and oral nutrition supplements. Encouraged ongoing intake of an oral nutrition supplement BID at home (discussed ones with adequate calories/protein). Encouraged ongoing intake of daily MVI.  NUTRITION DIAGNOSIS:   Malnutrition (Severe) related to chronic illness (stage IV lung cancer) as evidenced by severe depletion of body fat, severe depletion of muscle mass.  GOAL:   Patient will meet greater than or equal to 90% of their needs, Weight gain  MONITOR:   PO intake, Supplement acceptance, Labs, Weight trends, Skin, I & O's  REASON FOR ASSESSMENT:   Other (Comment) (Low BMI)    ASSESSMENT:   83 year old male with PMHx of DM type 2, HTN, stage IV non-small cell lung cancer who presented with shortness of breath found to have acute respiratory failure with hypoxia secondary to pleural effusion and advanced COPD.   -Patient followed by [facility] for lung cancer. Hx of surgical resection of left upper lobe 01/2009 at [facility]. In February 2013 had local recurrence and underwent XRT and carboplatin/Taxol. Had allergic reaction to carboplatin. Received single agent pemetrexed (completed 25 cycles by Sept 2015). Was off of therapy until CT 07/2014 found recurrent pulmonary nodules. Resumed single agent pemetrexed  (changed from q4 wks to q3 wks on 01/15/2016). -Pt s/p US thoracentesis today which removed 300 ml of slightly hazy, amber fluid.  Spoke with patient, his wife, and his granddaughter at bedside. Patient was initially diagnosed with cancer in 2011. He reports his appetite is on and off. Last chemotherapy was 07/01/2016. Since then his appetite and intake have been unchanged but he has been losing weight. He reports eating 3 meals per day at home. For breakfast he has Cheerios or shredded wheat in 2% milk with fruit. For lunch he may have a tomato and [place] sandwich with mayonnaise. For dinner he has a variety of things (country steak with gravy over rice or potatoes, steak with salad and baked potato). He reports finishing "most" of his meals. His wife recently purchased Ensure Plus, which he enjoys. Has only had two bottles so far. He also enjoys Engineer, civil (consulting). Patient denies any N/V, abdominal pain, difficulty chewing/swallowing, or constipation/diarrhea. He has partial dentures that are capped, so they do not come out. He reports having normal bowel movements every other day. He takes a daily MVI.  Prior to initiation of treatment patient reports he used to weigh 145 lbs. Just a few months ago he was around 120 lbs. Noted in chart patient was 121 lbs on 07/01/2016. He has lost 13 lbs (10.7% body weight) over the past 2 months, which is significant for time frame.  Meal Completion: 75% of lunch today per chart  Medications reviewed and include: folic acid 683 micrograms daily, metformin 1000 mg daily, linagliptin 5 mg daily, Remeron 15 mg daily, pantoprazole.  Labs reviewed: CBG 103-406 past 24 hrs, Creatinine 0.58. No HgbA1c.  Nutrition-Focused physical exam completed. Findings are severe fat depletion,  severe muscle depletion, and no edema.   Discussed with RN.  Diet Order:  Diet Carb Modified Fluid consistency: Thin; Room service appropriate? Yes  Skin:  Wound (see comment) (redness to  coccyx)  Last BM:  PTA (08/31/2016 per chart)  Height:   Ht Readings from Last 1 Encounters:  09/01/16 5\' 7"  (1.702 m)    Weight:   Wt Readings from Last 1 Encounters:  09/01/16 108 lb (49 kg)    Ideal Body Weight:  67.3 kg  BMI:  Body mass index is 16.92 kg/m.  Estimated Nutritional Needs:   Kcal:  1510-1740 (MSJ x 1.3-1.5)  Protein:  74-88 grams (1.5-1.8 grams/kg)  Fluid:  1.2-1.5 L/day (25-30 ml/kg)  EDUCATION NEEDS:   Education needs addressed  Willey Blade, MS, RD, LDN Pager: [phone] After Hours Pager: [phone]

## 2016-09-02 NOTE — Procedures (Signed)
Ultrasound-guided diagnostic and therapeutic right thoracentesis performed yielding 300 cc of slightly hazy, amber fluid. The pleural collection was multiloculated. No immediate complications. Follow-up chest x-ray pending. The fluid was submitted to the laboratory for preordered studies.

## 2016-09-02 NOTE — Progress Notes (Signed)
[place] [facility] [place] NAME: Justin Wade    MR#:  [id number]  DATE OF BIRTH:  01/03/1933  SUBJECTIVE:  CHIEF COMPLAINT:   Chief Complaint  Patient presents with  . Shortness of Breath   Complains of shortness of breath with minimal ambulation. No chest pain or orthopnea. No lower extremity edema.  REVIEW OF SYSTEMS:    Review of Systems  Constitutional: Positive for malaise/fatigue. Negative for chills and fever.  HENT: Negative for sore throat.   Eyes: Negative for blurred vision, double vision and pain.  Respiratory: Positive for cough and shortness of breath. Negative for hemoptysis and wheezing.   Cardiovascular: Negative for chest pain, palpitations, orthopnea and leg swelling.  Gastrointestinal: Negative for abdominal pain, constipation, diarrhea, heartburn, nausea and vomiting.  Genitourinary: Negative for dysuria and hematuria.  Musculoskeletal: Negative for back pain and joint pain.  Skin: Negative for rash.  Neurological: Positive for weakness. Negative for sensory change, speech change, focal weakness and headaches.  Endo/Heme/Allergies: Does not bruise/bleed easily.  Psychiatric/Behavioral: Negative for depression. The patient is not nervous/anxious.     DRUG ALLERGIES:  No Known Allergies  VITALS:  Blood pressure (!) 108/58, pulse 94, temperature 97.8 F (36.6 C), temperature source Oral, resp. rate 20, height 5\' 7"  (1.702 m), weight 49 kg (108 lb), SpO2 99 %.  PHYSICAL EXAMINATION:   Physical Exam  GENERAL:  83 y.o.-year-old patient lying in the bed with no acute distress.  EYES: Pupils equal, round, reactive to light and accommodation. No scleral icterus. Extraocular muscles intact.  HEENT: Head atraumatic, normocephalic. Oropharynx and nasopharynx clear.  NECK:  Supple, no jugular venous distention. No thyroid enlargement, no tenderness.  LUNGS: Normal breath sounds bilaterally, no wheezing, rales, rhonchi. No  use of accessory muscles of respiration.  CARDIOVASCULAR: S1, S2 normal. No murmurs, rubs, or gallops.  ABDOMEN: Soft, nontender, nondistended. Bowel sounds present. No organomegaly or mass.  EXTREMITIES: No cyanosis, clubbing or edema b/l.    NEUROLOGIC: Cranial nerves II through XII are intact. No focal Motor or sensory deficits b/l.   PSYCHIATRIC: The patient is alert and oriented x 3.  SKIN: No obvious rash, lesion, or ulcer.   LABORATORY PANEL:   CBC  Recent Labs Lab 09/02/16 0423  WBC 12.5*  HGB 9.6*  HCT 29.5*  PLT 275   ------------------------------------------------------------------------------------------------------------------ Chemistries   Recent Labs Lab 09/02/16 0423  NA 140  K 4.1  CL 103  CO2 31  GLUCOSE 187*  BUN 19  CREATININE 0.58*  CALCIUM 8.6*   ------------------------------------------------------------------------------------------------------------------  Cardiac Enzymes  Recent Labs Lab 09/01/16 1502  TROPONINI <0.03   ------------------------------------------------------------------------------------------------------------------  RADIOLOGY:  Dg Chest 1 View  Result Date: 09/02/2016 CLINICAL DATA:  Post right thoracentesis. EXAM: CHEST 1 VIEW COMPARISON:  09/01/2016 FINDINGS: There continues to be pleural-based densities in the lower chest bilaterally. Based on the recent ultrasound-guided thoracentesis, suspect that the pleural fluid is loculated. Negative for pneumothorax following the right thoracentesis. The pleural based disease has not significantly changed since 09/01/2016. Heart size is grossly stable. Stable appearance of the right jugular Port-A-Cath with tip at the cavoatrial junction. Again noted are ill-defined opacities in the periphery of the left lung and unclear if these represent pleural-based lesions. Again noted are surgical staples near the left lung apex. IMPRESSION: Minimal change in the bilateral pleural disease.  Suspect this represents scarring and loculated pleural fluid. No evidence for a pneumothorax following the right thoracentesis. Persistent densities in the periphery of  the left lung. Findings are nonspecific and could be better characterized with CT. Electronically Signed   By: Markus Daft M.D.   On: 09/02/2016 13:03   Dg Chest 2 View  Result Date: 09/01/2016 CLINICAL DATA:  Shortness of breath.  History of lung carcinoma. EXAM: CHEST  2 VIEW COMPARISON:  12/26/2013 FINDINGS: Stable positioning of right-sided Port-A-Cath. The heart size and mediastinal contours are within normal limits. New right pleural effusion of small to moderate volume. Left pleural fluid volume smaller than on the prior chest x-ray with relatively small effusion present. Vague irregular opacity in the peripheral aspect of the left upper lung may represent pleural or parenchymal opacity. Underlying chronic lung disease present with scattered areas of parenchymal scarring. No definite pneumonia or pulmonary edema. No pneumothorax identified. The visualized skeletal structures are unremarkable. IMPRESSION: New right pleural effusion of small to moderate volume. Left pleural fluid volume appears smaller compared to a prior chest x-ray in 2015. Irregular opacity in the left lung may be of pleural or parenchymal origin. Cannot exclude an irregular pulmonary nodule in this region. Consider follow-up with CT of the chest unless there are recent outside CT studies of the chest. Electronically Signed   By: Aletta Edouard M.D.   On: 09/01/2016 17:39   US Thoracentesis Asp Pleural Space W/img Guide  Result Date: 09/02/2016 INDICATION: Lung cancer, loculated right pleural effusion. Request made for diagnostic and therapeutic right thoracentesis. EXAM: ULTRASOUND GUIDED DIAGNOSTIC AND THERAPEUTIC RIGHT THORACENTESIS MEDICATIONS: None. COMPLICATIONS: None immediate. PROCEDURE: An ultrasound guided thoracentesis was thoroughly discussed with the  patient and questions answered. The benefits, risks, alternatives and complications were also discussed. The patient understands and wishes to proceed with the procedure. Written consent was obtained. Ultrasound was performed to localize and mark an adequate pocket of fluid in the right chest. The area was then prepped and draped in the normal sterile fashion. 1% Lidocaine was used for local anesthesia. Under ultrasound guidance a Safe-T-Centesis catheter was introduced. Thoracentesis was performed. The catheter was removed and a dressing applied. FINDINGS: A total of approximately 300 cc of slightly hazy, amber fluid was removed. Samples were sent to the laboratory as requested by the clinical team. IMPRESSION: Successful ultrasound guided diagnostic and therapeutic right thoracentesis yielding 300 cc of pleural fluid. Read by: Rowe Robert, PA-C Electronically Signed   By: Markus Daft M.D.   On: 09/02/2016 12:26     ASSESSMENT AND PLAN:   83 year old male with past medical history of hypertension, diabetes, lung cancer currently undergoing chemotherapy presents to the hospital due to shortness of breath.  * Acute respiratory failure with hypoxia-secondary to pleural effusion and advanced COPD  patient is status post left thoracentesis with 300 ML hazy fluid removed. Seems loculated. We will consult pulmonary for further input.  * Diabetes type 2 without complication-continue metformin, Januvia, glimepiride.  * Essential hypertension-continue metoprolol.  * Hyperlipidemia-continue atorvastatin.  * Hx of Lung cancer - currently ongoing chemo. Follows at [facility].   All the records are reviewed and case discussed with Care Management/Social Worker Management plans discussed with the patient, family and they are in agreement.  CODE STATUS: DNR  DVT Prophylaxis: SCDs  TOTAL TIME TAKING CARE OF THIS PATIENT: 30 minutes.   POSSIBLE D/C IN 1-2 DAYS, DEPENDING ON CLINICAL CONDITION.  Hillary Bow R M.D on 09/02/2016 at 1:50 PM  Between 7am to 6pm - Pager - [phone]  After 6pm go to [email or web address] - password [email or web address] [place]  Office  [phone]  CC: Primary care physician; Idelle Crouch, MD  Note: This dictation was prepared with Dragon dictation along with smaller phrase technology. Any transcriptional errors that result from this process are unintentional.

## 2016-09-02 NOTE — Consult Note (Signed)
[facility] Consultation      Assessment  --Progressive dyspnea, with acute hypoxic respiratory failure, likely multifactorial from pleural effusion, chronic severe emphysema, bronchiectasis, left pleural scarring.  --I discussed the possible etiologies of the patient's right pleural effusion, which would be likely infection versus recurrent cancer. His right pleural effusion is very loculated, not amenable to further thoracentesis. His declining physical status. Eczema poor candidate for any interventional procedures such as chest tube placement, drainage or thoracotomy, in addition to pleural effusion appears to small to be causing this degree of dyspnea on its own.  Plan:  -We discussed that we would treat treatable with the broad-spectrum IV antibiotics, however, expect to see some degree of improvement in the next 48-72 hours. -We will also consult palliative care to discuss potential goals of care, given his advanced cancer, we discussed that if he does not improve he may be at the end-stage of his disease. We discussed options. If he does not improve, such as quality of life versus continued treatment.     Date: 09/02/2016  MRN# [id number] Justin Wade 1932/03/24  Referring Physician:   [name]. is a 83 y.o. old male seen in consultation for chief complaint of:    Chief Complaint  Patient presents with  . Shortness of Breath    HPI:  The patient is an 83 yo male with a history of COPD, NSCLC, stage 4. He initially underwent a surgical resection (left lower lobectomy) on 01/2009. Developed local regional recurrence on 02/2011, underwent ChemoRT.  He developed bilateral lung and adrenal disease, diagnosed with stage 4 lung disease on 11/2011 and underwent chemo until 09/2013.  He was suspected of having bilateral pleural disease, thoracentesis 01/15/16 was negative.  He was then noted to have no evidence of re accumulation of pleural fluid  on 04/08/16.   He presents now to the hospital with progressive dyspnea, cough, denies fevers. He was found to have a right pleural effusion, underwent a right thoracentesis on 09/02/16 with removal of 300cc of amber colored fluid, the effusion appeared to be multiloculated and cytology is pending. He has again been undergoing chemotherapy every 3-4 weeks, most recently in June of this year. Patient's wife is at bedside and notes that after his last chemotherapy treatment. He started to decline in terms of his respiratory status, and overall fatigue level. His breathing continued to decline with progressive dyspnea on exertion until it was no longer tolerable, the patient then presented to the hospital.  I have personally reviewed images, his most recent CXR shows a small to right pleural effusion, with chronic right pleural disease from previous surgery/radiation. Review of most recent CT images shows severe emphysema, bronchiectasis, right pleural effusion.    PMHX:   Past Medical History:  Diagnosis Date  . Diabetes mellitus without complication ([place])   . Hypertension   . Lung cancer [facility])    Surgical Hx:  Past Surgical History:  Procedure Laterality Date  . LUNG LOBECTOMY     Family Hx:  Family History  Problem Relation Age of Onset  . Diabetes Father   . Vascular Disease Father    Social Hx:   Social History  Substance Use Topics  . Smoking status: Former Smoker    Packs/day: 0.50    Years: 35.00    Types: Cigarettes  . Smokeless tobacco: Never Used  . Alcohol use No   Medication:    Current Facility-Administered Medications:  .  acetaminophen (TYLENOL) tablet  650 mg, 650 mg, Oral, Q6H PRN **OR** acetaminophen (TYLENOL) suppository 650 mg, 650 mg, Rectal, Q6H PRN, Sainani, Vivek J, MD .  aspirin EC tablet 81 mg, 81 mg, Oral, Daily, Henreitta Leber, MD, 81 mg at 09/02/16 0935 .  atorvastatin (LIPITOR) tablet 20 mg, 20 mg, Oral, QHS, Sainani, Belia Heman, MD, 20 mg at 09/01/16  2316 .  chlorpheniramine-HYDROcodone (TUSSIONEX) 10-8 MG/5ML suspension 5 mL, 5 mL, Oral, Q12H PRN, Sainani, Vivek J, MD .  enoxaparin (LOVENOX) injection 40 mg, 40 mg, Subcutaneous, Q24H, Sainani, Vivek J, MD .  folic acid (FOLVITE) tablet 0.5 mg, 500 mcg, Oral, Daily, Sainani, Vivek J, MD, 0.5 mg at 09/02/16 0935 .  glimepiride (AMARYL) tablet 4 mg, 4 mg, Oral, BID, Henreitta Leber, MD, 4 mg at 09/02/16 0931 .  guaiFENesin (MUCINEX) 12 hr tablet 600 mg, 600 mg, Oral, BID PRN, Henreitta Leber, MD .  linagliptin (TRADJENTA) tablet 5 mg, 5 mg, Oral, Daily, 5 mg at 09/02/16 0931 **AND** metFORMIN (GLUCOPHAGE) tablet 1,000 mg, 1,000 mg, Oral, Q breakfast, Henreitta Leber, MD, 1,000 mg at 09/02/16 0931 .  metoprolol succinate (TOPROL-XL) 24 hr tablet 50 mg, 50 mg, Oral, Daily, Henreitta Leber, MD, 50 mg at 09/02/16 0935 .  mirtazapine (REMERON) tablet 15 mg, 15 mg, Oral, QHS, Sainani, Belia Heman, MD, 15 mg at 09/01/16 2316 .  ondansetron (ZOFRAN) tablet 4 mg, 4 mg, Oral, Q6H PRN **OR** ondansetron (ZOFRAN) injection 4 mg, 4 mg, Intravenous, Q6H PRN, Sainani, Vivek J, MD .  pantoprazole (PROTONIX) EC tablet 40 mg, 40 mg, Oral, Daily, Sainani, Belia Heman, MD, 40 mg at 09/02/16 0175   Allergies:  Patient has no known allergies.  Review of Systems: Gen:  Denies  fever, sweats, chills HEENT: Denies blurred vision, double vision. bleeds, sore throat Cvc:  No dizziness, chest pain. Resp:   Denies cough or sputum production, shortness of breath Gi: Denies swallowing difficulty, stomach pain. Gu:  Denies bladder incontinence, burning urine Ext:   No Joint pain, stiffness. Skin: No skin rash,  hives  Endoc:  No polyuria, polydipsia. Psych: No depression, insomnia. Other:  All other systems were reviewed with the patient and were negative other that what is mentioned in the HPI.   Physical Examination:   VS: BP (!) 108/58   Pulse 94   Temp 97.8 F (36.6 C) (Oral)   Resp 20   Ht 5\' 7"  (1.702 m)    Wt 108 lb (49 kg)   SpO2 99%   BMI 16.92 kg/m   General Appearance: No distress  Neuro:without focal findings,  speech normal,  HEENT: PERRLA, EOM intact.   Pulmonary: normal breath sounds, No wheezing. Decreased air entry bilaterally.  CardiovascularNormal S1,S2.  No m/r/g.   Abdomen: Benign, Soft, non-tender. Renal:  No costovertebral tenderness  GU:  No performed at this time. Endoc: No evident thyromegaly, no signs of acromegaly. Skin:   warm, no rashes, no ecchymosis  Extremities: normal, no cyanosis, clubbing.  Other findings:    LABORATORY PANEL:   CBC  Recent Labs Lab 09/02/16 0423  WBC 12.5*  HGB 9.6*  HCT 29.5*  PLT 275   ------------------------------------------------------------------------------------------------------------------  Chemistries   Recent Labs Lab 09/02/16 0423  NA 140  K 4.1  CL 103  CO2 31  GLUCOSE 187*  BUN 19  CREATININE 0.58*  CALCIUM 8.6*   ------------------------------------------------------------------------------------------------------------------  Cardiac Enzymes  Recent Labs Lab 09/01/16 1502  TROPONINI <0.03   ------------------------------------------------------------  RADIOLOGY:  Dg Chest  1 View  Result Date: 09/02/2016 CLINICAL DATA:  Post right thoracentesis. EXAM: CHEST 1 VIEW COMPARISON:  09/01/2016 FINDINGS: There continues to be pleural-based densities in the lower chest bilaterally. Based on the recent ultrasound-guided thoracentesis, suspect that the pleural fluid is loculated. Negative for pneumothorax following the right thoracentesis. The pleural based disease has not significantly changed since 09/01/2016. Heart size is grossly stable. Stable appearance of the right jugular Port-A-Cath with tip at the cavoatrial junction. Again noted are ill-defined opacities in the periphery of the left lung and unclear if these represent pleural-based lesions. Again noted are surgical staples near the left lung  apex. IMPRESSION: Minimal change in the bilateral pleural disease. Suspect this represents scarring and loculated pleural fluid. No evidence for a pneumothorax following the right thoracentesis. Persistent densities in the periphery of the left lung. Findings are nonspecific and could be better characterized with CT. Electronically Signed   By: Markus Daft M.D.   On: 09/02/2016 13:03   Dg Chest 2 View  Result Date: 09/01/2016 CLINICAL DATA:  Shortness of breath.  History of lung carcinoma. EXAM: CHEST  2 VIEW COMPARISON:  12/26/2013 FINDINGS: Stable positioning of right-sided Port-A-Cath. The heart size and mediastinal contours are within normal limits. New right pleural effusion of small to moderate volume. Left pleural fluid volume smaller than on the prior chest x-ray with relatively small effusion present. Vague irregular opacity in the peripheral aspect of the left upper lung may represent pleural or parenchymal opacity. Underlying chronic lung disease present with scattered areas of parenchymal scarring. No definite pneumonia or pulmonary edema. No pneumothorax identified. The visualized skeletal structures are unremarkable. IMPRESSION: New right pleural effusion of small to moderate volume. Left pleural fluid volume appears smaller compared to a prior chest x-ray in 2015. Irregular opacity in the left lung may be of pleural or parenchymal origin. Cannot exclude an irregular pulmonary nodule in this region. Consider follow-up with CT of the chest unless there are recent outside CT studies of the chest. Electronically Signed   By: Aletta Edouard M.D.   On: 09/01/2016 17:39   US Thoracentesis Asp Pleural Space W/img Guide  Result Date: 09/02/2016 INDICATION: Lung cancer, loculated right pleural effusion. Request made for diagnostic and therapeutic right thoracentesis. EXAM: ULTRASOUND GUIDED DIAGNOSTIC AND THERAPEUTIC RIGHT THORACENTESIS MEDICATIONS: None. COMPLICATIONS: None immediate. PROCEDURE: An  ultrasound guided thoracentesis was thoroughly discussed with the patient and questions answered. The benefits, risks, alternatives and complications were also discussed. The patient understands and wishes to proceed with the procedure. Written consent was obtained. Ultrasound was performed to localize and mark an adequate pocket of fluid in the right chest. The area was then prepped and draped in the normal sterile fashion. 1% Lidocaine was used for local anesthesia. Under ultrasound guidance a Safe-T-Centesis catheter was introduced. Thoracentesis was performed. The catheter was removed and a dressing applied. FINDINGS: A total of approximately 300 cc of slightly hazy, amber fluid was removed. Samples were sent to the laboratory as requested by the clinical team. IMPRESSION: Successful ultrasound guided diagnostic and therapeutic right thoracentesis yielding 300 cc of pleural fluid. Read by: Rowe Robert, PA-C Electronically Signed   By: Markus Daft M.D.   On: 09/02/2016 12:26       Thank  you for the consultation and for allowing [facility] to assist in the care of your patient. Our recommendations are noted above.  Please contact us if we can be of further service.   Marda Stalker, MD.  Board Certified in Internal Medicine, Pulmonary Medicine, [place], and Sleep Medicine.  [facility] Office Number: [phone]  Patricia Pesa, M.D.  Merton Border, M.D  09/02/2016

## 2016-09-02 NOTE — Progress Notes (Signed)
After 511mL bolus pt BP 109/59 (69). Called to MD with update. Agreered to hold dose of Remeron for tonight. Will monitor pt BP more closely throughout night. No more orders at this time.

## 2016-09-03 ENCOUNTER — Encounter: Payer: Self-pay | Admitting: Radiology

## 2016-09-03 ENCOUNTER — Inpatient Hospital Stay: Payer: Medicare Other

## 2016-09-03 DIAGNOSIS — Z87891 Personal history of nicotine dependence: Secondary | ICD-10-CM

## 2016-09-03 DIAGNOSIS — Z515 Encounter for palliative care: Secondary | ICD-10-CM

## 2016-09-03 DIAGNOSIS — Z7982 Long term (current) use of aspirin: Secondary | ICD-10-CM

## 2016-09-03 DIAGNOSIS — Z794 Long term (current) use of insulin: Secondary | ICD-10-CM

## 2016-09-03 DIAGNOSIS — Z79899 Other long term (current) drug therapy: Secondary | ICD-10-CM

## 2016-09-03 DIAGNOSIS — Z7189 Other specified counseling: Secondary | ICD-10-CM

## 2016-09-03 DIAGNOSIS — R630 Anorexia: Secondary | ICD-10-CM

## 2016-09-03 DIAGNOSIS — C3400 Malignant neoplasm of unspecified main bronchus: Secondary | ICD-10-CM

## 2016-09-03 DIAGNOSIS — J189 Pneumonia, unspecified organism: Secondary | ICD-10-CM

## 2016-09-03 DIAGNOSIS — R0602 Shortness of breath: Secondary | ICD-10-CM

## 2016-09-03 DIAGNOSIS — J441 Chronic obstructive pulmonary disease with (acute) exacerbation: Secondary | ICD-10-CM

## 2016-09-03 DIAGNOSIS — E119 Type 2 diabetes mellitus without complications: Secondary | ICD-10-CM

## 2016-09-03 DIAGNOSIS — I10 Essential (primary) hypertension: Secondary | ICD-10-CM

## 2016-09-03 DIAGNOSIS — J918 Pleural effusion in other conditions classified elsewhere: Secondary | ICD-10-CM

## 2016-09-03 LAB — GLUCOSE, CAPILLARY
GLUCOSE-CAPILLARY: 480 mg/dL — AB (ref 65–99)
GLUCOSE-CAPILLARY: 415 mg/dL — AB (ref 65–99)
Glucose-Capillary: 434 mg/dL — ABNORMAL HIGH (ref 65–99)
Glucose-Capillary: 400 mg/dL — ABNORMAL HIGH (ref 65–99)
Glucose-Capillary: 352 mg/dL — ABNORMAL HIGH (ref 65–99)

## 2016-09-03 LAB — MISC LABCORP TEST (SEND OUT): LABCORP TEST CODE: 19588

## 2016-09-03 LAB — PH, BODY FLUID: pH, Body Fluid: 8.1

## 2016-09-03 MED ORDER — IOPAMIDOL (ISOVUE-370) INJECTION 76%
75.0000 mL | Freq: Once | INTRAVENOUS | Status: AC | PRN
  Administered 2016-09-03: 75 mL via INTRAVENOUS

## 2016-09-03 MED ORDER — METOPROLOL SUCCINATE ER 25 MG PO TB24
12.5000 mg | ORAL_TABLET | Freq: Every day | ORAL | Status: DC
  Administered 2016-09-04 – 2016-09-05 (×2): 12.5 mg via ORAL
  Filled 2016-09-03 (×2): qty 1

## 2016-09-03 MED ORDER — INSULIN ASPART 100 UNIT/ML ~~LOC~~ SOLN
0.0000 [IU] | Freq: Three times a day (TID) | SUBCUTANEOUS | Status: DC
  Administered 2016-09-03 (×2): 15 [IU] via SUBCUTANEOUS
  Administered 2016-09-04: 12:00:00 3 [IU] via SUBCUTANEOUS
  Administered 2016-09-04: 11 [IU] via SUBCUTANEOUS
  Administered 2016-09-05: 13:00:00 8 [IU] via SUBCUTANEOUS
  Filled 2016-09-03 (×5): qty 1

## 2016-09-03 MED ORDER — INSULIN GLARGINE 100 UNIT/ML ~~LOC~~ SOLN
15.0000 [IU] | Freq: Once | SUBCUTANEOUS | Status: AC
  Administered 2016-09-03: 15:00:00 15 [IU] via SUBCUTANEOUS
  Filled 2016-09-03: qty 0.15

## 2016-09-03 MED ORDER — INSULIN GLARGINE 100 UNIT/ML ~~LOC~~ SOLN: 15.0000 [IU] | Freq: Every day | SUBCUTANEOUS | Status: DC

## 2016-09-03 MED ORDER — FUROSEMIDE 10 MG/ML IJ SOLN
20.0000 mg | Freq: Four times a day (QID) | INTRAMUSCULAR | Status: AC
  Administered 2016-09-03 – 2016-09-04 (×3): 20 mg via INTRAVENOUS
  Filled 2016-09-03 (×3): qty 2

## 2016-09-03 MED ORDER — PREDNISONE 50 MG PO TABS
50.0000 mg | ORAL_TABLET | Freq: Every day | ORAL | Status: DC
  Administered 2016-09-04 – 2016-09-05 (×2): 50 mg via ORAL
  Filled 2016-09-03 (×2): qty 1

## 2016-09-03 MED ORDER — MORPHINE SULFATE (CONCENTRATE) 10 MG/0.5ML PO SOLN: 2.5000 mg | ORAL | Status: DC | PRN

## 2016-09-03 MED ORDER — INSULIN ASPART 100 UNIT/ML ~~LOC~~ SOLN
0.0000 [IU] | Freq: Every day | SUBCUTANEOUS | Status: DC
  Administered 2016-09-03: 5 [IU] via SUBCUTANEOUS
  Administered 2016-09-04: 22:00:00 4 [IU] via SUBCUTANEOUS
  Filled 2016-09-03 (×2): qty 1

## 2016-09-03 NOTE — Consult Note (Signed)
[facility] CONSULT NOTE  Patient Care Team: Idelle Crouch, MD as PCP - General (Internal Medicine)  CHIEF COMPLAINTS/PURPOSE OF CONSULTATION:  Metastatic lung cancer  HISTORY OF PRESENTING ILLNESS:  Justin Wade. 83 y.o.  male history of recurrent/ metastatic adenocarcinoma with lung most recently on Alimta chemotherapy/maintenance [last treatment on June 12th; follows up with Dr. Lonia Chimera at [place].   Patient had missed appointments at [facility] the last few weeks because of feeling poorly/worsening shortness of breath.   Patient on admission the hospital noted to have pleural effusion for which he underwent thoracentesis- of about 300 cc of fluid. Patient continues to have shortness of breath at rest on exertion. Complains of worsening cough. No hemoptysis.  Patient complains of a poor appetite. No nausea no vomiting. Denies any headaches. Denies any pain. He denies any headaches.  ROS: A complete 10 point review of system is done which is negative except mentioned above in history of present illness  MEDICAL HISTORY:  Past Medical History:  Diagnosis Date  . Diabetes mellitus without complication ([place])   . Hypertension   . Lung cancer [facility])     SURGICAL HISTORY: Patient lives with his wife in [place]. Former smoker. No alcohol. Past Surgical History:  Procedure Laterality Date  . LUNG LOBECTOMY      SOCIAL HISTORY: Social History   Social History  . Marital status: Married    Spouse name: N/A  . Number of children: N/A  . Years of education: N/A   Occupational History  . Not on file.   Social History Main Topics  . Smoking status: Former Smoker    Packs/day: 0.50    Years: 35.00    Types: Cigarettes  . Smokeless tobacco: Never Used  . Alcohol use No  . Drug use: No  . Sexual activity: No   Other Topics Concern  . Not on file   Social History Narrative  . No narrative on file    FAMILY HISTORY: Family History  Problem  Relation Age of Onset  . Diabetes Father   . Vascular Disease Father     ALLERGIES:  has No Known Allergies.  MEDICATIONS:  Current Facility-Administered Medications  Medication Dose Route Frequency Provider Last Rate Last Dose  . acetaminophen (TYLENOL) tablet 650 mg  650 mg Oral Q6H PRN Henreitta Leber, MD       Or  . acetaminophen (TYLENOL) suppository 650 mg  650 mg Rectal Q6H PRN Henreitta Leber, MD      . aspirin EC tablet 81 mg  81 mg Oral Daily Henreitta Leber, MD   81 mg at 09/03/16 0816  . atorvastatin (LIPITOR) tablet 20 mg  20 mg Oral QHS Henreitta Leber, MD   20 mg at 09/02/16 2141  . chlorpheniramine-HYDROcodone (TUSSIONEX) 10-8 MG/5ML suspension 5 mL  5 mL Oral Q12H PRN Henreitta Leber, MD      . enoxaparin (LOVENOX) injection 40 mg  40 mg Subcutaneous Q24H Henreitta Leber, MD   40 mg at 09/02/16 2141  . feeding supplement (ENSURE ENLIVE) (ENSURE ENLIVE) liquid 237 mL  237 mL Oral BID BM Hillary Bow, MD   237 mL at 09/03/16 1532  . folic acid (FOLVITE) tablet 0.5 mg  500 mcg Oral Daily Henreitta Leber, MD   0.5 mg at 09/03/16 0816  . furosemide (LASIX) injection 20 mg  20 mg Intravenous Q6H Laverle Hobby, MD   20 mg at 09/03/16 1531  .  glimepiride (AMARYL) tablet 4 mg  4 mg Oral BID Henreitta Leber, MD   4 mg at 09/03/16 0859  . guaiFENesin (MUCINEX) 12 hr tablet 600 mg  600 mg Oral BID PRN Henreitta Leber, MD      . insulin aspart (novoLOG) injection 0-15 Units  0-15 Units Subcutaneous TID WC Hillary Bow, MD   15 Units at 09/03/16 1751  . insulin aspart (novoLOG) injection 0-5 Units  0-5 Units Subcutaneous QHS Sudini, Srikar, MD      . ipratropium-albuterol (DUONEB) 0.5-2.5 (3) MG/3ML nebulizer solution 3 mL  3 mL Nebulization Q6H Laverle Hobby, MD   3 mL at 09/03/16 1403  . linagliptin (TRADJENTA) tablet 5 mg  5 mg Oral Daily Henreitta Leber, MD   5 mg at 09/03/16 0816   And  . metFORMIN (GLUCOPHAGE) tablet 1,000 mg  1,000 mg Oral Q  breakfast Henreitta Leber, MD   1,000 mg at 09/03/16 0816  . [START ON 09/04/2016] metoprolol succinate (TOPROL-XL) 24 hr tablet 12.5 mg  12.5 mg Oral Daily Sudini, Srikar, MD      . mirtazapine (REMERON) tablet 15 mg  15 mg Oral QHS Henreitta Leber, MD   15 mg at 09/01/16 2316  . morphine CONCENTRATE 10 MG/0.5ML oral solution 2.6 mg  2.6 mg Sublingual Q3H PRN Basilio Cairo, NP      . multivitamin with minerals tablet 1 tablet  1 tablet Oral Daily Hillary Bow, MD   1 tablet at 09/03/16 0817  . ondansetron (ZOFRAN) tablet 4 mg  4 mg Oral Q6H PRN Henreitta Leber, MD       Or  . ondansetron (ZOFRAN) injection 4 mg  4 mg Intravenous Q6H PRN Henreitta Leber, MD      . pantoprazole (PROTONIX) EC tablet 40 mg  40 mg Oral Daily Henreitta Leber, MD   40 mg at 09/03/16 0817  . piperacillin-tazobactam (ZOSYN) IVPB 3.375 g  3.375 g Intravenous Q8H Sudini, Srikar, MD 12.5 mL/hr at 09/03/16 1752 3.375 g at 09/03/16 1752  . [START ON 09/04/2016] predniSONE (DELTASONE) tablet 50 mg  50 mg Oral Q breakfast Sudini, Srikar, MD      . sodium chloride flush (NS) 0.9 % injection 10-40 mL  10-40 mL Intracatheter PRN Hillary Bow, MD          .  PHYSICAL EXAMINATION:  Vitals:   09/03/16 1425 09/03/16 1500  BP: (!) 99/49 (!) 108/57  Pulse: (!) 105   Resp:    Temp: 98.6 F (37 C)   SpO2: 100%    Filed Weights   09/01/16 1431 09/01/16 1930  Weight: 109 lb (49.4 kg) 108 lb (49 kg)    GENERAL: Thin built cachectic-appearing Caucasian male patient Alert, mild respiratory distress.  Alone. He is on 2 L nasal cannula EYES: no pallor or icterus OROPHARYNX: no thrush or ulceration. NECK: supple, no masses felt LYMPH:  no palpable lymphadenopathy in the cervical, axillary or inguinal regions LUNGS: decreased breath sounds to auscultation at bases and  No wheeze or crackles HEART/CVS: regular rate & rhythm and no murmurs; No lower extremity edema ABDOMEN: abdomen soft, non-tender and normal bowel  sounds Musculoskeletal:no cyanosis of digits and no clubbing  PSYCH: alert & oriented x 3 with fluent speech NEURO: no focal motor/sensory deficits SKIN:  no rashes or significant lesions  LABORATORY DATA:  I have reviewed the data as listed Lab Results  Component Value Date   WBC 12.5 (H) 09/02/2016  HGB 9.6 (L) 09/02/2016   HCT 29.5 (L) 09/02/2016   MCV 82.1 09/02/2016   PLT 275 09/02/2016    Recent Labs  09/01/16 1502 09/02/16 0423  NA 135 140  K 4.7 4.1  CL 97* 103  CO2 27 31  GLUCOSE 546* 187*  BUN 24* 19  CREATININE 0.86 0.58*  CALCIUM 9.0 8.6*  GFRNONAA >60 >60  GFRAA >60 >60    RADIOGRAPHIC STUDIES: I have personally reviewed the radiological images as listed and agreed with the findings in the report.   ASSESSMENT & PLAN:  # 83 year old male patient with history of recurrent/ Metastatic adenocarcinoma of the lung currently on maintenance therapy with Alimta- is currently admitted to the hospital for worsening shortness of breath/cough/right-sided pleural effusion.  # Recurrent metastatic adenocarcinoma the lung- on Alimta. Question progression given the worsening effusion. Await Cytology. Patient is interested in transferring his care to [facility]/ given the logistics of travel to [facility].   # Worsening shortness of breath/ right-sided pleural effusion- not improved post thoracentesis. Question PE. Recommend CTA PE protocol. Await CT scan- question need for Pleurx catheter.   # The above plan of care was discussed with the patient in detail. Also reviewed with Dr.Sudini.  Thank you Dr. Darvin Neighbours for allowing me to participate in the care of your pleasant patient. Please do not hesitate to contact me with questions or concerns in the interim. All questions were answered. The patient knows to call the clinic with any problems, questions or concerns.    Cammie Sickle, MD 09/03/2016 6:31 PM

## 2016-09-03 NOTE — Progress Notes (Addendum)
[facility] [place] NAME: Justin Wade    MR#:  [id number]  DATE OF BIRTH:  04-11-1932  SUBJECTIVE:  CHIEF COMPLAINT:   Chief Complaint  Patient presents with  . Shortness of Breath   Visibly short of breath in bed. Wife at bedside. On 2 L oxygen. Good appetite. Afebrile.  REVIEW OF SYSTEMS:    Review of Systems  Constitutional: Positive for malaise/fatigue. Negative for chills and fever.  HENT: Negative for sore throat.   Eyes: Negative for blurred vision, double vision and pain.  Respiratory: Positive for cough and shortness of breath. Negative for hemoptysis and wheezing.   Cardiovascular: Negative for chest pain, palpitations, orthopnea and leg swelling.  Gastrointestinal: Negative for abdominal pain, constipation, diarrhea, heartburn, nausea and vomiting.  Genitourinary: Negative for dysuria and hematuria.  Musculoskeletal: Negative for back pain and joint pain.  Skin: Negative for rash.  Neurological: Positive for weakness. Negative for sensory change, speech change, focal weakness and headaches.  Endo/Heme/Allergies: Does not bruise/bleed easily.  Psychiatric/Behavioral: Negative for depression. The patient is not nervous/anxious.     DRUG ALLERGIES:  No Known Allergies  VITALS:  Blood pressure (!) 99/55, pulse 97, temperature 97.8 F (36.6 C), temperature source Oral, resp. rate 20, height 5\' 7"  (1.702 m), weight 49 kg (108 lb), SpO2 100 %.  PHYSICAL EXAMINATION:   Physical Exam  GENERAL:  83 y.o.-year-old patient lying in the bed with conversational dyspnea EYES: Pupils equal, round, reactive to light and accommodation. No scleral icterus. Extraocular muscles intact.  HEENT: Head atraumatic, normocephalic. Oropharynx and nasopharynx clear.  NECK:  Supple, no jugular venous distention. No thyroid enlargement, no tenderness.  LUNGS: Normal breath sounds bilaterally, no wheezing, rales, rhonchi. No use of accessory  muscles of respiration.  CARDIOVASCULAR: S1, S2 normal. No murmurs, rubs, or gallops.  ABDOMEN: Soft, nontender, nondistended. Bowel sounds present. No organomegaly or mass.  EXTREMITIES: No cyanosis, clubbing or edema b/l.    NEUROLOGIC: Cranial nerves II through XII are intact. No focal Motor or sensory deficits b/l.   PSYCHIATRIC: The patient is alert and oriented x 3.  SKIN: No obvious rash, lesion, or ulcer.   LABORATORY PANEL:   CBC  Recent Labs Lab 09/02/16 0423  WBC 12.5*  HGB 9.6*  HCT 29.5*  PLT 275   ------------------------------------------------------------------------------------------------------------------ Chemistries   Recent Labs Lab 09/02/16 0423  NA 140  K 4.1  CL 103  CO2 31  GLUCOSE 187*  BUN 19  CREATININE 0.58*  CALCIUM 8.6*   ------------------------------------------------------------------------------------------------------------------  Cardiac Enzymes  Recent Labs Lab 09/01/16 1502  TROPONINI <0.03   ------------------------------------------------------------------------------------------------------------- RADIOLOGY:  Dg Chest 1 View  Result Date: 09/02/2016 CLINICAL DATA:  Post right thoracentesis. EXAM: CHEST 1 VIEW COMPARISON:  09/01/2016 FINDINGS: There continues to be pleural-based densities in the lower chest bilaterally. Based on the recent ultrasound-guided thoracentesis, suspect that the pleural fluid is loculated. Negative for pneumothorax following the right thoracentesis. The pleural based disease has not significantly changed since 09/01/2016. Heart size is grossly stable. Stable appearance of the right jugular Port-A-Cath with tip at the cavoatrial junction. Again noted are ill-defined opacities in the periphery of the left lung and unclear if these represent pleural-based lesions. Again noted are surgical staples near the left lung apex. IMPRESSION: Minimal change in the bilateral pleural disease. Suspect this represents  scarring and loculated pleural fluid. No evidence for a pneumothorax following the right thoracentesis. Persistent densities in the periphery of the left lung. Findings  are nonspecific and could be better characterized with CT. Electronically Signed   By: Markus Daft M.D.   On: 09/02/2016 13:03   Dg Chest 2 View  Result Date: 09/01/2016 CLINICAL DATA:  Shortness of breath.  History of lung carcinoma. EXAM: CHEST  2 VIEW COMPARISON:  12/26/2013 FINDINGS: Stable positioning of right-sided Port-A-Cath. The heart size and mediastinal contours are within normal limits. New right pleural effusion of small to moderate volume. Left pleural fluid volume smaller than on the prior chest x-ray with relatively small effusion present. Vague irregular opacity in the peripheral aspect of the left upper lung may represent pleural or parenchymal opacity. Underlying chronic lung disease present with scattered areas of parenchymal scarring. No definite pneumonia or pulmonary edema. No pneumothorax identified. The visualized skeletal structures are unremarkable. IMPRESSION: New right pleural effusion of small to moderate volume. Left pleural fluid volume appears smaller compared to a prior chest x-ray in 2015. Irregular opacity in the left lung may be of pleural or parenchymal origin. Cannot exclude an irregular pulmonary nodule in this region. Consider follow-up with CT of the chest unless there are recent outside CT studies of the chest. Electronically Signed   By: Aletta Edouard M.D.   On: 09/01/2016 17:39   US Thoracentesis Asp Pleural Space W/img Guide  Result Date: 09/02/2016 INDICATION: Lung cancer, loculated right pleural effusion. Request made for diagnostic and therapeutic right thoracentesis. EXAM: ULTRASOUND GUIDED DIAGNOSTIC AND THERAPEUTIC RIGHT THORACENTESIS MEDICATIONS: None. COMPLICATIONS: None immediate. PROCEDURE: An ultrasound guided thoracentesis was thoroughly discussed with the patient and questions  answered. The benefits, risks, alternatives and complications were also discussed. The patient understands and wishes to proceed with the procedure. Written consent was obtained. Ultrasound was performed to localize and mark an adequate pocket of fluid in the right chest. The area was then prepped and draped in the normal sterile fashion. 1% Lidocaine was used for local anesthesia. Under ultrasound guidance a Safe-T-Centesis catheter was introduced. Thoracentesis was performed. The catheter was removed and a dressing applied. FINDINGS: A total of approximately 300 cc of slightly hazy, amber fluid was removed. Samples were sent to the laboratory as requested by the clinical team. IMPRESSION: Successful ultrasound guided diagnostic and therapeutic right thoracentesis yielding 300 cc of pleural fluid. Read by: Rowe Robert, PA-C Electronically Signed   By: Markus Daft M.D.   On: 09/02/2016 12:26   ASSESSMENT AND PLAN:   83 year old male with past medical history of hypertension, diabetes, lung cancer currently undergoing chemotherapy presents to the hospital due to shortness of breath.  * Acute respiratory failure with hypoxia-secondary to pleural effusion and advanced COPD  patient is status post left thoracentesis with 300 ML hazy fluid removed. Underlying pneumonia likely. ON IV abx Loculated effusion  * Diabetes type 2 , uncontrolled-continue metformin, Januvia, glimepiride. Add one time dose of lantus and SSI On IV steroids  * Essential hypertension-continue metoprolol.  * Hyperlipidemia-continue atorvastatin.  * Hx of Lung cancer - currently ongoing chemo. Follows at [facility]. Overall poor prognosis As per discussion with Dr. Ashby Dawes. Palliative care consulted.   All the records are reviewed and case discussed with Care Management/Social Worker Management plans discussed with the patient, family and they are in agreement.  CODE STATUS: DNR  DVT Prophylaxis: SCDs  TOTAL TIME TAKING  CARE OF THIS PATIENT: 30 minutes.   POSSIBLE D/C IN 1-2 DAYS, DEPENDING ON CLINICAL CONDITION.  Hillary Bow R M.D on 09/03/2016 at 1:29 PM  Between 7am to 6pm - Pager -  [phone]  After 6pm go to [email or web address] - password [email or web address] [facility]  Office  [phone]  CC: Primary care physician; Idelle Crouch, MD  Note: This dictation was prepared with Dragon dictation along with smaller phrase technology. Any transcriptional errors that result from this process are unintentional.

## 2016-09-03 NOTE — Consult Note (Signed)
[facility]     Assessment  --Progressive dyspnea, with acute hypoxic respiratory failure, likely multifactorial from pleural effusion, chronic severe emphysema, bronchiectasis, left pleural scarring.  --I discussed the possible etiologies of the patient's right pleural effusion, which would be likely infection versus recurrent cancer. His right pleural effusion is very loculated, not amenable to further thoracentesis. His declining physical status makes him a poor candidate for any interventional procedures such as chest tube placement, drainage or thoracotomy, in addition the pleural effusion appears too small to be causing this degree of dyspnea on its own.  Plan:  -We discussed that we would treat what is treatable with continued broad-spectrum IV antibiotics, steroids for possible AECOPD, and lasix, I expect to see some degree of improvement in the next 48 hours. -We have consulted palliative care to discuss potential goals of care, given his advanced cancer, we discussed that if he does not improve he may be at the end-stage of his disease. We discussed options. If he does not improve, such as considerations of quality of life versus living as long as possible with continued treatment.     Date: 09/03/2016  MRN# [id number] Justin Wade Sep 20, 1932  Referring Physician:   [name]. is a 83 y.o. old male seen in consultation for chief complaint of:    Chief Complaint  Patient presents with  . Shortness of Breath    Subjective: The patient feels slightly better today. He looks less dyspneic than yesterday.    Medication:    Current Facility-Administered Medications:  .  acetaminophen (TYLENOL) tablet 650 mg, 650 mg, Oral, Q6H PRN **OR** acetaminophen (TYLENOL) suppository 650 mg, 650 mg, Rectal, Q6H PRN, Sainani, Vivek J, MD .  aspirin EC tablet 81 mg, 81 mg, Oral, Daily, Henreitta Leber, MD, 81 mg at 09/03/16 0816 .  atorvastatin  (LIPITOR) tablet 20 mg, 20 mg, Oral, QHS, Sainani, Belia Heman, MD, 20 mg at 09/02/16 2141 .  chlorpheniramine-HYDROcodone (TUSSIONEX) 10-8 MG/5ML suspension 5 mL, 5 mL, Oral, Q12H PRN, Sainani, Vivek J, MD .  enoxaparin (LOVENOX) injection 40 mg, 40 mg, Subcutaneous, Q24H, Sainani, Belia Heman, MD, 40 mg at 09/02/16 2141 .  feeding supplement (ENSURE ENLIVE) (ENSURE ENLIVE) liquid 237 mL, 237 mL, Oral, BID BM, Sudini, Srikar, MD, 237 mL at 09/02/16 1634 .  folic acid (FOLVITE) tablet 0.5 mg, 500 mcg, Oral, Daily, Sainani, Belia Heman, MD, 0.5 mg at 09/03/16 0816 .  glimepiride (AMARYL) tablet 4 mg, 4 mg, Oral, BID, Henreitta Leber, MD, 4 mg at 09/03/16 0859 .  guaiFENesin (MUCINEX) 12 hr tablet 600 mg, 600 mg, Oral, BID PRN, Verdell Carmine, Vivek J, MD .  insulin aspart (novoLOG) injection 0-15 Units, 0-15 Units, Subcutaneous, TID WC, Hillary Bow, MD, 15 Units at 09/03/16 1227 .  insulin aspart (novoLOG) injection 0-5 Units, 0-5 Units, Subcutaneous, QHS, Sudini, Srikar, MD .  ipratropium-albuterol (DUONEB) 0.5-2.5 (3) MG/3ML nebulizer solution 3 mL, 3 mL, Nebulization, Q6H, Ashby Dawes, Talissa Apple, MD, 3 mL at 09/03/16 0751 .  linagliptin (TRADJENTA) tablet 5 mg, 5 mg, Oral, Daily, 5 mg at 09/03/16 0816 **AND** metFORMIN (GLUCOPHAGE) tablet 1,000 mg, 1,000 mg, Oral, Q breakfast, Henreitta Leber, MD, 1,000 mg at 09/03/16 0816 .  methylPREDNISolone sodium succinate (SOLU-MEDROL) 40 mg/mL injection 40 mg, 40 mg, Intravenous, Q8H, Laverle Hobby, MD, 40 mg at 09/03/16 0509 .  [START ON 09/04/2016] metoprolol succinate (TOPROL-XL) 24 hr tablet 12.5 mg, 12.5 mg, Oral, Daily, Sudini, Srikar, MD .  mirtazapine (REMERON)  tablet 15 mg, 15 mg, Oral, QHS, Sainani, Vivek J, MD, 15 mg at 09/01/16 2316 .  multivitamin with minerals tablet 1 tablet, 1 tablet, Oral, Daily, Hillary Bow, MD, 1 tablet at 09/03/16 0817 .  ondansetron (ZOFRAN) tablet 4 mg, 4 mg, Oral, Q6H PRN **OR** ondansetron (ZOFRAN) injection 4 mg, 4 mg,  Intravenous, Q6H PRN, Sainani, Vivek J, MD .  pantoprazole (PROTONIX) EC tablet 40 mg, 40 mg, Oral, Daily, Henreitta Leber, MD, 40 mg at 09/03/16 0817 .  piperacillin-tazobactam (ZOSYN) IVPB 3.375 g, 3.375 g, Intravenous, Q8H, Hillary Bow, MD, Stopped at 09/03/16 1216 .  sodium chloride flush (NS) 0.9 % injection 10-40 mL, 10-40 mL, Intracatheter, PRN, Hillary Bow, MD   Allergies:  Patient has no known allergies.  Review of Systems: Gen:  Denies  fever, sweats, chills HEENT: Denies blurred vision, double vision. bleeds, sore throat Cvc:  No dizziness, chest pain. Resp:   Denies cough or sputum production, shortness of breath Gi: Denies swallowing difficulty, stomach pain. Gu:  Denies bladder incontinence, burning urine Ext:   No Joint pain, stiffness. Skin: No skin rash,  hives  Endoc:  No polyuria, polydipsia. Psych: No depression, insomnia. Other:  All other systems were reviewed with the patient and were negative other that what is mentioned in the HPI.   Physical Examination:   VS: BP (!) 99/55 (BP Location: Left Arm)   Pulse 97   Temp 97.8 F (36.6 C) (Oral)   Resp 20   Ht 5\' 7"  (1.702 m)   Wt 108 lb (49 kg)   SpO2 100%   BMI 16.92 kg/m   General Appearance: No distress, looks less dyspneic than yesterday.  Neuro:without focal findings,  speech normal,  HEENT: PERRLA, EOM intact.   Pulmonary: normal breath sounds, No wheezing. Decreased air entry bilaterally.  CardiovascularNormal S1,S2.  No m/r/g.   Abdomen: Benign, Soft, non-tender. Renal:  No costovertebral tenderness  GU:  No performed at this time. Endoc: No evident thyromegaly, no signs of acromegaly. Skin:   warm, no rashes, no ecchymosis  Extremities: normal, no cyanosis, clubbing.  Other findings:    LABORATORY PANEL:   CBC  Recent Labs Lab 09/02/16 0423  WBC 12.5*  HGB 9.6*  HCT 29.5*  PLT 275    ------------------------------------------------------------------------------------------------------------------  Chemistries   Recent Labs Lab 09/02/16 0423  NA 140  K 4.1  CL 103  CO2 31  GLUCOSE 187*  BUN 19  CREATININE 0.58*  CALCIUM 8.6*   ------------------------------------------------------------------------------------------------------------------  Cardiac Enzymes  Recent Labs Lab 09/01/16 1502  TROPONINI <0.03   ------------------------------------------------------------  RADIOLOGY:  Dg Chest 1 View  Result Date: 09/02/2016 CLINICAL DATA:  Post right thoracentesis. EXAM: CHEST 1 VIEW COMPARISON:  09/01/2016 FINDINGS: There continues to be pleural-based densities in the lower chest bilaterally. Based on the recent ultrasound-guided thoracentesis, suspect that the pleural fluid is loculated. Negative for pneumothorax following the right thoracentesis. The pleural based disease has not significantly changed since 09/01/2016. Heart size is grossly stable. Stable appearance of the right jugular Port-A-Cath with tip at the cavoatrial junction. Again noted are ill-defined opacities in the periphery of the left lung and unclear if these represent pleural-based lesions. Again noted are surgical staples near the left lung apex. IMPRESSION: Minimal change in the bilateral pleural disease. Suspect this represents scarring and loculated pleural fluid. No evidence for a pneumothorax following the right thoracentesis. Persistent densities in the periphery of the left lung. Findings are nonspecific and could be better characterized with CT.  Electronically Signed   By: Markus Daft M.D.   On: 09/02/2016 13:03   Dg Chest 2 View  Result Date: 09/01/2016 CLINICAL DATA:  Shortness of breath.  History of lung carcinoma. EXAM: CHEST  2 VIEW COMPARISON:  12/26/2013 FINDINGS: Stable positioning of right-sided Port-A-Cath. The heart size and mediastinal contours are within normal limits. New  right pleural effusion of small to moderate volume. Left pleural fluid volume smaller than on the prior chest x-ray with relatively small effusion present. Vague irregular opacity in the peripheral aspect of the left upper lung may represent pleural or parenchymal opacity. Underlying chronic lung disease present with scattered areas of parenchymal scarring. No definite pneumonia or pulmonary edema. No pneumothorax identified. The visualized skeletal structures are unremarkable. IMPRESSION: New right pleural effusion of small to moderate volume. Left pleural fluid volume appears smaller compared to a prior chest x-ray in 2015. Irregular opacity in the left lung may be of pleural or parenchymal origin. Cannot exclude an irregular pulmonary nodule in this region. Consider follow-up with CT of the chest unless there are recent outside CT studies of the chest. Electronically Signed   By: Aletta Edouard M.D.   On: 09/01/2016 17:39   US Thoracentesis Asp Pleural Space W/img Guide  Result Date: 09/02/2016 INDICATION: Lung cancer, loculated right pleural effusion. Request made for diagnostic and therapeutic right thoracentesis. EXAM: ULTRASOUND GUIDED DIAGNOSTIC AND THERAPEUTIC RIGHT THORACENTESIS MEDICATIONS: None. COMPLICATIONS: None immediate. PROCEDURE: An ultrasound guided thoracentesis was thoroughly discussed with the patient and questions answered. The benefits, risks, alternatives and complications were also discussed. The patient understands and wishes to proceed with the procedure. Written consent was obtained. Ultrasound was performed to localize and mark an adequate pocket of fluid in the right chest. The area was then prepped and draped in the normal sterile fashion. 1% Lidocaine was used for local anesthesia. Under ultrasound guidance a Safe-T-Centesis catheter was introduced. Thoracentesis was performed. The catheter was removed and a dressing applied. FINDINGS: A total of approximately 300 cc of  slightly hazy, amber fluid was removed. Samples were sent to the laboratory as requested by the clinical team. IMPRESSION: Successful ultrasound guided diagnostic and therapeutic right thoracentesis yielding 300 cc of pleural fluid. Read by: Rowe Robert, PA-C Electronically Signed   By: Markus Daft M.D.   On: 09/02/2016 12:26       Thank  you for the consultation and for allowing [facility] to assist in the care of your patient. Our recommendations are noted above.  Please contact us if we can be of further service.   Marda Stalker, MD.  Board Certified in Internal Medicine, Pulmonary Medicine, [place], and Sleep Medicine.  [facility] Office Number: [phone]  Patricia Pesa, M.D.  Merton Border, M.D  09/03/2016

## 2016-09-03 NOTE — Progress Notes (Signed)
ANTIBIOTIC CONSULT NOTE  Pharmacy Consult for Zosyn  Indication: HCAP   No Known Allergies  Patient Measurements: Height: 5\' 7"  (170.2 cm) Weight: 108 lb (49 kg) IBW/kg (Calculated) : 66.1 Adjusted Body Weight:   Vital Signs: Temp: 97.8 F (36.6 C) (08/15 0852) Temp Source: Oral (08/15 0852) BP: 99/55 (08/15 0855) Pulse Rate: 97 (08/15 0852) Intake/Output from previous day: 08/14 0701 - 08/15 0700 In: 460 [P.O.:360; IV Piggyback:100] Out: -  Intake/Output from this shift: Total I/O In: 360 [P.O.:360] Out: -   Labs:  Recent Labs  09/01/16 1502 09/02/16 0423  WBC 18.2* 12.5*  HGB 11.2* 9.6*  PLT 331 275  CREATININE 0.86 0.58*   Estimated Creatinine Clearance: 50.2 mL/min (A) (by C-G formula based on SCr of 0.58 mg/dL (L)). No results for input(s): VANCOTROUGH, VANCOPEAK, VANCORANDOM, GENTTROUGH, GENTPEAK, GENTRANDOM, TOBRATROUGH, TOBRAPEAK, TOBRARND, AMIKACINPEAK, AMIKACINTROU, AMIKACIN in the last 72 hours.   Microbiology: Recent Results (from the past 720 hour(s))  Body fluid culture     Status: None (Preliminary result)   Collection Time: 09/02/16 12:11 PM  Result Value Ref Range Status   Specimen Description PLEURAL  Final   Special Requests NONE  Final   Gram Stain   Final    FEW WBC PRESENT, PREDOMINANTLY MONONUCLEAR NO ORGANISMS SEEN Performed at [facility], [address] [place], [place] [address]    Culture PENDING  Incomplete   Report Status PENDING  Incomplete    Medical History: Past Medical History:  Diagnosis Date  . Diabetes mellitus without complication ([place])   . Hypertension   . Lung cancer ([place])     Medications:  Scheduled:  . aspirin EC  81 mg Oral Daily  . atorvastatin  20 mg Oral QHS  . enoxaparin (LOVENOX) injection  40 mg Subcutaneous Q24H  . feeding supplement (ENSURE ENLIVE)  237 mL Oral BID BM  . folic acid  681 mcg Oral Daily  . glimepiride  4 mg Oral BID  . ipratropium-albuterol  3 mL Nebulization Q6H  .  linagliptin  5 mg Oral Daily   And  . metFORMIN  1,000 mg Oral Q breakfast  . methylPREDNISolone (SOLU-MEDROL) injection  40 mg Intravenous Q8H  . [START ON 09/04/2016] metoprolol succinate  12.5 mg Oral Daily  . mirtazapine  15 mg Oral QHS  . multivitamin with minerals  1 tablet Oral Daily  . pantoprazole  40 mg Oral Daily   Assessment: Pharmacy consulted to dose zosyn in this 83 year old male with HCAP.  CrCl = 50.2 ml/min (8/14) Pleural fluid culture pending   Goal of Therapy:  resolution of infection  Plan:  Continue Zosyn 3.375 gm IV Q8H EI Expected duration 7 days with resolution of temperature and/or normalization of WBC    Candelaria Stagers  Pharmacy Resident  09/03/2016,10:31 AM

## 2016-09-03 NOTE — Consult Note (Signed)
Consultation Note Date: 09/03/2016  Patient Name: Justin Wade.  DOB: Apr 04, 1932  MRN: [id number]  Age / Sex: 83 y.o., male  PCP: Justin Crouch, MD Referring Physician: Hillary Bow, MD  Reason for Consultation: Establishing goals of care  HPI/Patient Profile: 83 y.o. male  with past medical history of hypertension, diabetes mellitus, lung cancer, and lung lobectomy admitted on 09/01/2016 with shortness of breath. Followed by [facility] oncology for lung cancer. Notes reviewed. Last chemo 07/01/16. In ED, chest xray revealed moderate right pleural effusion s/p thoracentesis with 321m removed. Effusion is loculated. Underlying pneumonia and receiving antibiotics. Palliative medicine consultation for goals of care.   Clinical Assessment and Goals of Care: I have reviewed medical records, discussed with care team, and met with patient and wife at bedside to discuss diagnosis, GOC, EOL wishes, disposition and options.  Introduced Palliative Medicine as specialized medical care for people living with serious illness. It focuses on providing relief from the symptoms and stress of a serious illness. The goal is to improve quality of life for both the patient and the family.  We discussed a brief life review of the patient. Married to wife (Justin Wade for 567years. Three children and four grandchildren. 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 They also enjoy spending time with grandchildren. Patient and wife share journey through cancer diagnosis for 7-8 years including resection, chemo/radiation. [facility] [place] Patient and wife feel he has declined since last round of chemotherapy in June with increased weakness and shortness of breath  with minimal exertion.   Discussed diagnoses and interventions. Patient and wife understand pleural effusion is loculated and he is not a candidate for drain or surgery.    I attempted to elicit values and goals of care important to the patient. Patient tells me he does not want "to continue to live if I'm going to breath like this." His wife describes him as "independent and active" and now he becomes short of breath with the least bit of exertion. He feels chemotherapy made him feel worse. He is interested in trying immunotherapy if recommended ([facility] [place].     Advanced directives, concepts specific to code status, and artifical feeding and hydration were discussed. Patient has a documented living will and HCPOA (wife). Documentation is not in epic. Encouraged wife to bring documents to hospital tomorrow to be placed in chart. Patient confirms DNR/DNI.   The difference between aggressive medical intervention and comfort care was considered in light of the patient's goals of care. They are interested in oncology input. We discussed palliative services to follow at home on discharge understanding this can transition to hospice at any time. Educated on focus on comfort and symptom management when they no longer pursue oncology treatment. Patient/wife confirm understanding and feel this will be beneficial  in the future to help keep him at home.   Patient is dyspneic at rest. We discussed low dose Roxanol for shortness of breath. Patient agreeable to try Roxanol.   Questions and concerns were addressed. Therapeutic listening and emotional support provided.      SUMMARY OF RECOMMENDATIONS    Patient confirms DNR.   Patient has documented living will/HCPOA. Encouraged wife to bring to hospital to scanned into chart.   Oncology consult placed. Interested in immunotherapy if recommended.   Low dose Roxanol for SOB. Discussed with Dr. Darvin Wade.  Agreeable with palliative  services to follow on discharge understanding this can transition to hospice services in the future.   Code Status/Advance Care Planning:  DNR  Symptom Management:   Roxanol 2.53m SL q3h prn severe pain/dyspnea  Palliative Prophylaxis:   Aspiration, Delirium Protocol, Frequent Pain Assessment, Oral Care and Turn Reposition  Psycho-social/Spiritual:   Desire for further Chaplaincy support:yes  Additional Recommendations: Caregiving  Support/Resources and Education on Hospice  Prognosis:   Unable to determine: guarded with acute on chronic respiratory failure secondary to lung cancer, loculated pleural effusion, and severe emphysema.   Discharge Planning: Likely home with palliative services      Primary Diagnoses: Present on Admission: . Acute respiratory failure with hypoxia ([place]   I have reviewed the medical record, interviewed the patient and family, and examined the patient. The following aspects are pertinent.  Past Medical History:  Diagnosis Date  . [place]   . Hypertension   . Lung [facility]    Social History   Social History  . Marital status: Married    Spouse name: N/A  . Number of children: N/A  . Years of education: N/A   Social History Main Topics  . Smoking status: Former Smoker    Packs/day: 0.50    Years: 35.00    Types: Cigarettes  . Smokeless tobacco: Never Used  . Alcohol use No  . Drug use: No  . Sexual activity: No   Other Topics Concern  . None   Social History Narrative  . None   Family History  Problem Relation Age of Onset  . Diabetes Father   . Vascular Disease Father    Scheduled Meds: . aspirin EC  81 mg Oral Daily  . atorvastatin  20 mg Oral QHS  . enoxaparin (LOVENOX) injection  40 mg Subcutaneous Q24H  . feeding supplement (ENSURE ENLIVE)  237 mL Oral BID BM  . folic acid  5655mcg Oral Daily  . furosemide  20 mg Intravenous Q6H  . glimepiride  4 mg Oral BID  . insulin  aspart  0-15 Units Subcutaneous TID WC  . insulin aspart  0-5 Units Subcutaneous QHS  . ipratropium-albuterol  3 mL Nebulization Q6H  . linagliptin  5 mg Oral Daily   And  . metFORMIN  1,000 mg Oral Q breakfast  . [START ON 09/04/2016] metoprolol succinate  12.5 mg Oral Daily  . mirtazapine  15 mg Oral QHS  . multivitamin with minerals  1 tablet Oral Daily  . pantoprazole  40 mg Oral Daily  . [START ON 09/04/2016] predniSONE  50 mg Oral Q breakfast   Continuous Infusions: . piperacillin-tazobactam (ZOSYN)  IV Stopped (09/03/16 1216)   PRN Meds:.acetaminophen **OR** acetaminophen, chlorpheniramine-HYDROcodone, guaiFENesin, morphine CONCENTRATE, ondansetron **OR** ondansetron (ZOFRAN) IV, sodium chloride flush Medications Prior to Admission:  Prior to Admission medications   Medication Sig Start Date End Date Taking? Authorizing Provider  aspirin  EC 81 MG tablet Take 81 mg by mouth daily.   Yes [provider]  atorvastatin (LIPITOR) 20 MG tablet Take 1 tablet by mouth at bedtime. 03/14/16  Yes [provider]  chlorpheniramine-HYDROcodone (TUSSIONEX) 10-8 MG/5ML SUER Take 5 mLs by mouth every 12 (twelve) hours as needed.  11/29/15  Yes [provider]  dexamethasone (DECADRON) 4 MG tablet Take 4 mg by mouth as needed.   Yes [provider]  folic acid (FOLVITE) 712 MCG tablet Take 400 mcg by mouth daily.    Yes [provider]  glimepiride (AMARYL) 4 MG tablet Take 4 mg by mouth 2 (two) times daily.  03/14/16  Yes [provider]  guaiFENesin (MUCINEX) 600 MG 12 hr tablet Take 600 mg by mouth as needed.  12/16/11  Yes [provider]  metoprolol succinate (TOPROL-XL) 50 MG 24 hr tablet Take 50 mg by mouth daily.  04/08/16 04/08/17 Yes [provider]  mirtazapine (REMERON) 15 MG tablet Take 15 mg by mouth. 06/09/16 06/09/17 Yes [provider]  omeprazole (PRILOSEC) 40 MG capsule Take 40 mg by mouth 2 (two) times  daily.  02/19/16  Yes [provider]  SitaGLIPtin-MetFORMIN HCl (JANUMET XR) [phone] MG TB24 Take 1 tablet by mouth daily.   Yes [provider]   No Known Allergies Review of Systems  Constitutional: Positive for activity change, appetite change and fatigue.  Respiratory: Positive for shortness of breath.   Neurological: Positive for weakness.   Physical Exam  Constitutional: He is oriented to person, place, and time. He is cooperative.  HENT:  Head: Normocephalic and atraumatic.  Cardiovascular: Regular rhythm.   Pulmonary/Chest: No accessory muscle usage. No respiratory distress. He has decreased breath sounds.  Dyspnea at rest  Abdominal: Normal appearance.  Neurological: He is alert and oriented to person, place, and time.  Skin: Skin is warm and dry. There is pallor.  Psychiatric: He has a normal mood and affect. His speech is normal and behavior is normal. Cognition and memory are normal.  Nursing note and vitals reviewed.  Vital Signs: BP (!) 108/57 (BP Location: Right Arm)   Pulse (!) 105   Temp 98.6 F (37 C) (Oral)   Resp 20   Ht 5' 7"  (1.702 m)   Wt 49 kg (108 lb)   SpO2 100%   BMI 16.92 kg/m  Pain Assessment: No/denies pain     SpO2: SpO2: 100 % O2 Device:SpO2: 100 % O2 Flow Rate: .O2 Flow Rate (L/min): 2 L/min  IO: Intake/output summary:   Intake/Output Summary (Last 24 hours) at 09/03/16 1540 Last data filed at 09/03/16 1500  Gross per 24 hour  Intake              870 ml  Output                0 ml  Net              870 ml    LBM: Last BM Date: 09/02/16 (per patient had a formed stool yesterday ) Baseline Weight: Weight: 49.4 kg (109 lb) Most recent weight: Weight: 49 kg (108 lb)     Palliative Assessment/Data: PPS 40%   Flowsheet Rows     Most Recent Value  Intake Tab  Referral Department  Hospitalist  Unit at Time of Referral  Oncology Unit  Palliative Care Primary Diagnosis  Cancer  Palliative Care Type  New Palliative  care  Reason for referral  Clarify Goals  of Care  Date first seen by Palliative Care  09/03/16  Clinical Assessment  Palliative Performance Scale Score  40%  Psychosocial & Spiritual Assessment  Palliative Care Outcomes  Patient/Family meeting held?  Yes  Who was at the meeting?  patient and wife  Palliative Care Outcomes  Clarified goals of care, Provided end of life care assistance, Provided psychosocial or spiritual support, ACP counseling assistance, Counseled regarding hospice, Linked to palliative care logitudinal support      Time In: 1430 Time Out: 1540 Time Total: 80mn Greater than 50%  of this time was spent counseling and coordinating care related to the above assessment and plan.  Signed by:  MIhor Dow FNP-C Palliative Medicine Team  Phone: [phone]: [phone]  Please contact Palliative Medicine Team phone at [phone] questions and concerns.  For individual provider: See AShea Evans

## 2016-09-03 NOTE — Progress Notes (Signed)
Chaplain received an order to visit with pt in room 126. Chaplain provided information for an Advanced Directive.    09/03/16 2010  Clinical Encounter Type  Visited With Patient  Visit Type Initial;Spiritual support  Referral From Nurse  Consult/Referral To Chaplain  Spiritual Encounters  Spiritual Needs Other (Comment)

## 2016-09-04 ENCOUNTER — Telehealth: Payer: Self-pay | Admitting: Internal Medicine

## 2016-09-04 DIAGNOSIS — Z7952 Long term (current) use of systemic steroids: Secondary | ICD-10-CM

## 2016-09-04 DIAGNOSIS — Z515 Encounter for palliative care: Secondary | ICD-10-CM

## 2016-09-04 DIAGNOSIS — C3490 Malignant neoplasm of unspecified part of unspecified bronchus or lung: Secondary | ICD-10-CM

## 2016-09-04 DIAGNOSIS — E1165 Type 2 diabetes mellitus with hyperglycemia: Secondary | ICD-10-CM

## 2016-09-04 DIAGNOSIS — R0602 Shortness of breath: Secondary | ICD-10-CM

## 2016-09-04 DIAGNOSIS — J90 Pleural effusion, not elsewhere classified: Secondary | ICD-10-CM

## 2016-09-04 DIAGNOSIS — Z7189 Other specified counseling: Secondary | ICD-10-CM

## 2016-09-04 LAB — GLUCOSE, CAPILLARY
GLUCOSE-CAPILLARY: 312 mg/dL — AB (ref 65–99)
GLUCOSE-CAPILLARY: 323 mg/dL — AB (ref 65–99)
Glucose-Capillary: 86 mg/dL (ref 65–99)
Glucose-Capillary: 162 mg/dL — ABNORMAL HIGH (ref 65–99)

## 2016-09-04 LAB — MISC LABCORP TEST (SEND OUT): Labcorp test code: 1453

## 2016-09-04 LAB — CYTOLOGY - NON PAP

## 2016-09-04 NOTE — Progress Notes (Signed)
Inpatient Diabetes Program Recommendations  AACE/ADA: New Consensus Statement on Inpatient Glycemic Control (2015)  Target Ranges:  Prepandial:   less than 140 mg/dL      Peak postprandial:   less than 180 mg/dL (1-2 hours)      Critically ill patients:  140 - 180 mg/dL   Lab Results  Component Value Date   GLUCAP 86 09/04/2016    Review of Glycemic Control  Results for Justin Wade, Justin Wade (MRN [id number]) as of 09/04/2016 10:29  Ref. Range 09/03/2016 12:14 09/03/2016 17:00 09/03/2016 17:02 09/03/2016 20:12 09/04/2016 07:57  Glucose-Capillary Latest Ref Range: 65 - 99 mg/dL 480 (H) 415 (H) 400 (H) 352 (H) 86    Diabetes history: Type 2 Outpatient Diabetes medications: Amaryl 4mg  bid, Janumet 100/1000mg  qday  Current orders for Inpatient glycemic control: Novolog 0-5 units qhs, Novolog 0-15units tid, Tradjenta 5mg  q day, Metformin 1000mg  bid, Amaryl 4mg  qday  * Prednisone 50mg  qam  Inpatient Diabetes Program Recommendations: Please d/c Amaryl and Metformin while patient is inpatient.   Gentry Fitz, RN, BA, MHA, CDE Diabetes Coordinator Inpatient Diabetes Program  [phone] (Team Pager) [phone] ([place]) 09/04/2016 10:33 AM

## 2016-09-04 NOTE — Addendum Note (Signed)
Addended by: Telford Nab on: 09/04/2016 11:28 AM   Modules accepted: Orders

## 2016-09-04 NOTE — Evaluation (Signed)
Physical Therapy Evaluation Patient Details Name: Justin Wade. MRN: [id number] DOB: [birth year] Today's Date: 09/04/2016   History of Present Illness  Justin Wade is a 83 y.o. male with a known history of cancer, diabetes, hypertension who presents to the hospital due to shortness of breath. Patient has a history of lung cancer, is extensively followed at [facility], and has ongoing chemotherapy. His last dose was over a month ago. He now presents with shortness of breath even at minimal exertion and also at rest. Patient admits to a cough which is productive with yellow sputum but no fevers, chills, nausea, vomiting, abdominal pain or any other associated symptoms. Since his shortness of breath was progressively getting worse he came to the ER for further evaluation and underwent a chest x-ray which showed a moderately sized right-sided pleural effusion. Hospitalist services were contacted further treatment and evaluation. Pt currently admitted for acute respiratory failure with hypoxia secondary to pleural effusion and advanced COPD.   Clinical Impression  Pt admitted with above diagnosis. Pt currently with functional limitations due to the deficits listed below (see PT Problem List). Generalized weakness and deconditioning throughout bilateral UE/LE. Pt is independent with bed mobility and supervision only for transfers. He requires CGA for ambulation and walks in room with decreased push off at terminal stance and mild instability. No overt LOB during ambulation but pt is unsteady. At rest on room air SaO2 drops to 92%. With exertion SaO2 drops to 87% and HR increases from 130's bpm at rest to 164 bpm. SaO2 does not recover so pt placed back on 2L/min supplemental O2 and SaO2 recovers to 96% and HR decreases to 145 bpm. With further exertion SaO2 remains at 96% on 2L/min O2 and HR increases back to 155. Pt with increase in RR with exertion and visible respiratory distress. He would benefit from [facility]  PT for strengthening, endurance, and balance. Recommend rolling walker for added stability as needed at home. OT evaluation would be appropriate as well for energy conservation techniques. Pt will benefit from PT services to address deficits in strength, balance, and mobility in order to return to full function at home.     Follow Up Recommendations Home health PT    Equipment Recommendations  Rolling walker with 5" wheels;Other (comment) (Home O2 (see readings in clinical impression))    Recommendations for Other Services OT consult;Other (comment) (For energy conservation)     Precautions / Restrictions Precautions Precautions: None Restrictions Weight Bearing Restrictions: No      Mobility  Bed Mobility Overal bed mobility: Independent             General bed mobility comments: Increased time but functional. HOB flat and no bed rails  Transfers Overall transfer level: Needs assistance Equipment used: None Transfers: Sit to/from Stand Sit to Stand: Supervision         General transfer comment: Pt demonstrates ability to come to standing with fair stability once upright. Speed is functional  Ambulation/Gait Ambulation/Gait assistance: Min guard Ambulation Distance (Feet): 40 Feet (20+20) Assistive device: None Gait Pattern/deviations: Decreased step length - right;Decreased step length - left Gait velocity: Decreased Gait velocity interpretation: <1.8 ft/sec, indicative of risk for recurrent falls General Gait Details: Pt ambulates in room with decreased push off at terminal stance and mild instability. No overt LOB during ambulation but pt is unsteady. At rest on room air SaO2 drops to 92%. With exertion SaO2 drops to 87% and HR increases from 130's bpm at rest to 164  bpm. SaO2 does not recover so pt placed back on 2L/min supplemental O2 and SaO2 recovers to 96% and HR decreases to 145 bpm. With further exertion SaO2 remains at 96% on 2L/min O2 and HR increases back  to 155. Pt with increase in RR with exertion and visible respiratory distress  Stairs            Wheelchair Mobility    Modified Rankin (Stroke Patients Only)       Balance Overall balance assessment: Needs assistance Sitting-balance support: No upper extremity supported Sitting balance-Leahy Scale: Good     Standing balance support: No upper extremity supported Standing balance-Leahy Scale: Fair Standing balance comment: Able to maintain wide and narrow stance balance without UE support. Positive Rhomberg for increased sway but no overt LOB. Single leg balance is approximately 4s                             Pertinent Vitals/Pain Pain Assessment: No/denies pain    Home Living Family/patient expects to be discharged to:: Private residence Living Arrangements: Spouse/significant other Available Help at Discharge: Family;Available 24 hours/day;Other (Comment) (Wife able to help. Extended family also nearby) Type of Home: House Home Access: Stairs to enter Entrance Stairs-Rails: Right Entrance Stairs-Number of Steps: 2 Home Layout: One level Home Equipment: Grab bars - tub/shower;Shower seat;Hand held shower head (no walker, no BSC, no cane, no wheelchair, no home O2)      Prior Function Level of Independence: Needs assistance   Gait / Transfers Assistance Needed: Household ambulator without assistive device. No falls in the last 12 months  ADL's / Homemaking Assistance Needed: Requiring assist from wife with bathing. Wife assists with IADLs        Hand Dominance   Dominant Hand: Right    Extremity/Trunk Assessment   Upper Extremity Assessment Upper Extremity Assessment: Generalized weakness    Lower Extremity Assessment Lower Extremity Assessment: Generalized weakness (Bilateral LE neuropathy)       Communication   Communication: No difficulties  Cognition Arousal/Alertness: Awake/alert Behavior During Therapy: WFL for tasks  assessed/performed Overall Cognitive Status: Within Functional Limits for tasks assessed                                        General Comments      Exercises     Assessment/Plan    PT Assessment Patient needs continued PT services  PT Problem List Decreased strength;Decreased activity tolerance;Decreased balance;Decreased mobility;Cardiopulmonary status limiting activity       PT Treatment Interventions DME instruction;Gait training;Stair training;Therapeutic activities;Therapeutic exercise;Balance training;Neuromuscular re-education;Patient/family education    PT Goals (Current goals can be found in the Care Plan section)  Acute Rehab PT Goals Patient Stated Goal: Return to prior function at home PT Goal Formulation: With patient Time For Goal Achievement: 09/18/16 Potential to Achieve Goals: Good    Frequency Min 2X/week   Barriers to discharge        Co-evaluation               AM-PAC PT "6 Clicks" Daily Activity  Outcome Measure Difficulty turning over in bed (including adjusting bedclothes, sheets and blankets)?: None Difficulty moving from lying on back to sitting on the side of the bed? : None Difficulty sitting down on and standing up from a chair with arms (e.g., wheelchair, bedside commode, etc,.)?: None Help needed  moving to and from a bed to chair (including a wheelchair)?: A Little Help needed walking in hospital room?: A Little Help needed climbing 3-5 steps with a railing? : A Little 6 Click Score: 21    End of Session Equipment Utilized During Treatment: Gait belt;Oxygen Activity Tolerance: Treatment limited secondary to medical complications (Comment) Patient left: in bed;with call bell/phone within reach;with bed alarm set Nurse Communication: Mobility status PT Visit Diagnosis: Unsteadiness on feet (R26.81);Muscle weakness (generalized) (M62.81);Difficulty in walking, not elsewhere classified (R26.2)    Time:  9373-4287 PT Time Calculation (min) (ACUTE ONLY): 21 min   Charges:   PT Evaluation $PT Eval Low Complexity: 1 Low     PT G Codes:   PT G-Codes **NOT FOR INPATIENT CLASS** Functional Assessment Tool Used: AM-PAC 6 Clicks Basic Mobility Functional Limitation: Mobility: Walking and moving around Mobility: Walking and Moving Around Current Status (G8115): At least 20 percent but less than 40 percent impaired, limited or restricted Mobility: Walking and Moving Around Goal Status [phone]): At least 1 percent but less than 20 percent impaired, limited or restricted    Phillips Grout PT, DPT    Wei Newbrough 09/04/2016, 5:08 PM

## 2016-09-04 NOTE — Progress Notes (Signed)
Advance care planning  Reviewed with patient his prognosis with lung cancer, severe COPD and decline in his health status over the past few months. Patient things chemotherapy has made things worse for him. He gets short of breath on minimal ambulation. Wants to change his care from [facility] center to [place] due to proximity. He has missed appointments at [facility] due to feeling too weak. Discussed regarding CODE STATUS and he confirms DO NOT RESUSCITATE and DO NOT INTUBATE. Changed CODE STATUS from full code to DO NOT RESUSCITATE. Discussed regarding consult during palliative care and pulmonary. Patient agrees.  Time spent 20 minutes

## 2016-09-04 NOTE — Telephone Encounter (Signed)
Yes, please schedule next Friday 8/24 per md.

## 2016-09-04 NOTE — Care Management Important Message (Signed)
Important Message  Patient Details  Name: Justin Wade. MRN: [id number] Date of Birth: 05/01/1932   Medicare Important Message Given:  Yes    Shelbie Ammons, RN 09/04/2016, 8:03 AM

## 2016-09-04 NOTE — Plan of Care (Signed)
Problem: Education: Goal: Knowledge of [facility] Education information/materials will improve Outcome: Progressing VSS, free of falls during shift.  Denies pain.  Reports DOE, ambulated to bathroom, used urinal post-Lasix.  No other complaints overnight.  Bed in low position, call bell within reach.  WCTM.

## 2016-09-04 NOTE — Progress Notes (Signed)
Justin Wade.   DOB:08/14/1937   ER#:[id number]    Subjective: Patient still visibly short of breath. He is up in the bed; on nasal cannula oxygen. He is having his breakfast.   ROS: Denies any chest pain. Denies any nausea vomiting diarrhea or constipation.  Objective:  Vitals:   09/04/16 0216 09/04/16 0409  BP:  90/65  Pulse:  (!) 121  Resp:  16  Temp:  97.7 F (36.5 C)  SpO2: 97% 97%     Intake/Output Summary (Last 24 hours) at 09/04/16 0830 Last data filed at 09/04/16 8563  Gross per 24 hour  Intake             1473 ml  Output             1350 ml  Net              123 ml    GENERAL: Thin built cachectic-appearing Caucasian male patient Alert, mild respiratory distress.  Alone. He is on 2 L nasal cannula EYES: no pallor or icterus OROPHARYNX: no thrush or ulceration. NECK: supple, no masses felt LYMPH:  no palpable lymphadenopathy in the cervical, axillary or inguinal regions LUNGS: decreased breath sounds to auscultation at bases and  No wheeze or crackles HEART/CVS: regular rate & rhythm and no murmurs; No lower extremity edema ABDOMEN: abdomen soft, non-tender and normal bowel sounds Musculoskeletal:no cyanosis of digits and no clubbing  PSYCH: alert & oriented x 3 with fluent speech NEURO: no focal motor/sensory deficits SKIN:  no rashes or significant lesions   Labs:  Lab Results  Component Value Date   WBC 12.5 (H) 09/02/2016   HGB 9.6 (L) 09/02/2016   HCT 29.5 (L) 09/02/2016   MCV 82.1 09/02/2016   PLT 275 09/02/2016   NEUTROABS 6.1 12/27/2013    Lab Results  Component Value Date   NA 140 09/02/2016   K 4.1 09/02/2016   CL 103 09/02/2016   CO2 31 09/02/2016    Studies:  Dg Chest 1 View  Result Date: 09/02/2016 CLINICAL DATA:  Post right thoracentesis. EXAM: CHEST 1 VIEW COMPARISON:  09/01/2016 FINDINGS: There continues to be pleural-based densities in the lower chest bilaterally. Based on the recent ultrasound-guided thoracentesis, suspect  that the pleural fluid is loculated. Negative for pneumothorax following the right thoracentesis. The pleural based disease has not significantly changed since 09/01/2016. Heart size is grossly stable. Stable appearance of the right jugular Port-A-Cath with tip at the cavoatrial junction. Again noted are ill-defined opacities in the periphery of the left lung and unclear if these represent pleural-based lesions. Again noted are surgical staples near the left lung apex. IMPRESSION: Minimal change in the bilateral pleural disease. Suspect this represents scarring and loculated pleural fluid. No evidence for a pneumothorax following the right thoracentesis. Persistent densities in the periphery of the left lung. Findings are nonspecific and could be better characterized with CT. Electronically Signed   By: Markus Daft M.D.   On: 09/02/2016 13:03   Ct Angio Chest Pe W Or Wo Contrast  Result Date: 09/03/2016 CLINICAL DATA:  Shortness of Breath EXAM: CT ANGIOGRAPHY CHEST WITH CONTRAST TECHNIQUE: Multidetector CT imaging of the chest was performed using the standard protocol during bolus administration of intravenous contrast. Multiplanar CT image reconstructions and MIPs were obtained to evaluate the vascular anatomy. CONTRAST:  75 mL Isovue 370. COMPARISON:  12/24/2013 FINDINGS: Cardiovascular: Thoracic aorta again demonstrates atherosclerotic calcifications without aneurysmal dilatation or dissection. The brachiocephalic vessels are well visualized.  Multifocal stenoses of the left common carotid artery are seen which have progressed in the interval from the prior exam. At least one of these appears pre occlusive. Pulmonary artery demonstrates postsurgical changes on the left consistent with the known lobectomy. No filling defect to suggest pulmonary embolism is seen. No significant cardiac enlargement is noted. Coronary calcifications are seen. Mediastinum/Nodes: The thoracic inlet is within normal limits. No  significant hilar or mediastinal adenopathy is noted. Some thickening of the distal esophagus is seen of uncertain significance. This was present on the prior exam and may be related to reflux. Lungs/Pleura: Diffuse emphysematous changes are seen. Scattered nodular changes are noted in the right upper lobe best seen on image number 41 of series 6 and in the middle lobe on image number 51 of series 6. The largest of these measures approximately 9 mm and was not present on the prior CT examination. Pleural based density on the left in the upper lobe is noted which measures approximately 14 mm. Some scattered smaller nodules are noted throughout the residual left lung. One of these measures approximately 8 mm best seen on image number 49 of series 6. Scarring related to the prior lobectomy is noted. Moderate size right-sided pleural effusion is noted with air within. The areas likely related to the recent thoracentesis. Right lower lobe atelectasis is seen. Upper Abdomen: Visualized upper abdomen shows enlargement of the right adrenal gland likely related to metastatic disease. This measures approximately 3 cm in greatest dimension. Musculoskeletal: Degenerative changes of the thoracic spine are noted. No definitive acute bony abnormality is noted. Review of the MIP images confirms the above findings. IMPRESSION: Changes consistent with left lower lobectomy. There are some nodular changes in the left upper lobe and residual portion of the left lower lobe which may be postinflammatory in nature. Correlation with recent CT would be helpful as these were not present on the prior exam from 2015. If any more recent CTs could be made available they could be compared. Right adrenal lesion likely representing metastatic disease. Changes consistent with recent right-sided thoracentesis with air within the pleural fluid. Nodular changes on the right which would also be helpful to compare with more recent exams. No evidence of  pulmonary embolism. Aortic Atherosclerosis (ICD10-I70.0) and Emphysema (ICD10-J43.9). Electronically Signed   By: Inez Catalina M.D.   On: 09/03/2016 17:51   US Thoracentesis Asp Pleural Space W/img Guide  Result Date: 09/02/2016 INDICATION: Lung cancer, loculated right pleural effusion. Request made for diagnostic and therapeutic right thoracentesis. EXAM: ULTRASOUND GUIDED DIAGNOSTIC AND THERAPEUTIC RIGHT THORACENTESIS MEDICATIONS: None. COMPLICATIONS: None immediate. PROCEDURE: An ultrasound guided thoracentesis was thoroughly discussed with the patient and questions answered. The benefits, risks, alternatives and complications were also discussed. The patient understands and wishes to proceed with the procedure. Written consent was obtained. Ultrasound was performed to localize and mark an adequate pocket of fluid in the right chest. The area was then prepped and draped in the normal sterile fashion. 1% Lidocaine was used for local anesthesia. Under ultrasound guidance a Safe-T-Centesis catheter was introduced. Thoracentesis was performed. The catheter was removed and a dressing applied. FINDINGS: A total of approximately 300 cc of slightly hazy, amber fluid was removed. Samples were sent to the laboratory as requested by the clinical team. IMPRESSION: Successful ultrasound guided diagnostic and therapeutic right thoracentesis yielding 300 cc of pleural fluid. Read by: Rowe Robert, PA-C Electronically Signed   By: Markus Daft M.D.   On: 09/02/2016 12:26    Assessment &  Plan:   # 83 year old male patient with history of recurrent/ Metastatic adenocarcinoma of the lung currently on maintenance therapy with Alimta- is currently admitted to the hospital for worsening shortness of breath/cough/right-sided pleural effusion.  # Recurrent metastatic adenocarcinoma the lung- on Alimta. Question progression given the worsening effusion. CTA-no PE;  shows loculated small effusion on the right side; severe COPD;  pleural nodularity [question malignancy versus inflammation]; but no overwhelming disease burden in the lungs. In reviewing the [facility] chart- it appears patient has not had immunotherapy so far. This would be a reasonable choice as the next choice Of therapy.   # Worsening shortness of breath/ right-sided pleural effusion- status post thoracentesis. Likely secondary to severe COPD.   # Elevated blood sugars- diabetes/ on steroids- on insulin.  # Borderline performance status- secondary to comorbidities/recurrent malignancy. Evaluated by palliative care. Discussed with palliative care provider- Ms.Ihor Dow.   # Patient follow-up with me in the clinic next week or so to discuss further treatment options. We'll plan to review patient's prior CT scan done at [facility] to the most recent one to evaluate the disease status.   Cammie Sickle, MD 09/04/2016  8:30 AM

## 2016-09-04 NOTE — Telephone Encounter (Signed)
Justin Wade- . I saw this patient in the hospital. He plans to move his care from [facility] to [place]. Please get prior CT scan and have it  Reviewed/compared to most recent CT scan done at this visit.    Also, make appt to see me mid-later next week/labs-cbc-cmp/TSH.

## 2016-09-04 NOTE — Care Management Note (Signed)
Case Management Note  Patient Details  Name: Justin Wade. MRN: [id number] Date of Birth: [birth year]  Subjective/Objective:  Admitted to [facility] with the diagnosis of respiratory failure. Lives with wife, Denice Paradise, [phone]). Last seen Dr, Doy Hutching on Monday. Prescriptions are filled at [facility] [place]. No home health. No skilled facility. No home oxygen. No medical equipment in the home. Takes care of all basic activities of daily living himself, can drive, if needed. No falls. Lost 12-14 pounds since June 2018.   Family will transport                Action/Plan: Lung Cancer. May need home oxygen. Will continue to follow   Expected Discharge Date:  09/04/16               Expected Discharge Plan:     In-House Referral:     Discharge planning Services     Post Acute Care Choice:    Choice offered to:     DME Arranged:    DME Agency:     HH Arranged:    HH Agency:     Status of Service:     If discussed at [employer], dates discussed:    Additional Comments:  Shelbie Ammons, [place] [facility] [phone] 09/04/2016, 8:24 AM

## 2016-09-04 NOTE — Telephone Encounter (Signed)
Request has been faxed to [facility] to [employer]. Would you like to see the patient Thursday afternoon or Friday? You can discuss his case at conference so his images can be compared with prior scans.  Let me know and I can coordinate his appt. Thanks.

## 2016-09-04 NOTE — Progress Notes (Addendum)
[facility]     Assessment  --Progressive dyspnea, with acute hypoxic respiratory failure, likely multifactorial from pleural effusion, chronic severe emphysema, bronchiectasis, left pleural scarring, right pleural effusion with suspected lung entrapment.  --I discussed the possible etiologies of the patient's right pleural effusion, which would be likely infection versus recurrent cancer. His right pleural effusion is very loculated, s/p thora with 300 cc removed with little relief. His declining physical status makes him a poor candidate for any interventional procedures such as chest tube placement, drainage or thoracotomy, in addition the pleural effusion appears too small to be causing this degree of dyspnea on its own.  Plan:  -We discussed that we would treat what is treatable with continued broad-spectrum IV antibiotics, steroids for possible AECOPD, and lasix. --He has been weaned down to 1L , pt looks better today, his breathing is slightly improved, but still winded with mild activity.  -Can likely DC home tomorrow from respiratory standpoint, will likely need oxygen. Can change abx to PO, and taper steroids.  --Pt asked to follow up with [place] outpatient.    Will sign off for now, please call if there are any further questions or concerns.      Date: 09/04/2016  MRN# [id number] Justin Wade [birth year]  Referring Physician:   [name]. is a 83 y.o. old male seen in consultation for chief complaint of:    Chief Complaint  Patient presents with  . Shortness of Breath    Subjective: The patient feels slightly better today. He looks less dyspneic than yesterday.    Medication:    Current Facility-Administered Medications:  .  acetaminophen (TYLENOL) tablet 650 mg, 650 mg, Oral, Q6H PRN **OR** acetaminophen (TYLENOL) suppository 650 mg, 650 mg, Rectal, Q6H PRN, Sainani, Vivek J, MD .  aspirin EC tablet 81 mg, 81 mg, Oral, Daily,  Sainani, Belia Heman, MD, 81 mg at 09/04/16 1000 .  atorvastatin (LIPITOR) tablet 20 mg, 20 mg, Oral, QHS, Sainani, Belia Heman, MD, 20 mg at 09/03/16 2104 .  chlorpheniramine-HYDROcodone (TUSSIONEX) 10-8 MG/5ML suspension 5 mL, 5 mL, Oral, Q12H PRN, Sainani, Vivek J, MD .  enoxaparin (LOVENOX) injection 40 mg, 40 mg, Subcutaneous, Q24H, Sainani, Belia Heman, MD, 40 mg at 09/03/16 2104 .  feeding supplement (ENSURE ENLIVE) (ENSURE ENLIVE) liquid 237 mL, 237 mL, Oral, BID BM, Sudini, Srikar, MD, 237 mL at 09/04/16 1000 .  folic acid (FOLVITE) tablet 0.5 mg, 500 mcg, Oral, Daily, Sainani, Vivek J, MD, 0.5 mg at 09/04/16 1000 .  glimepiride (AMARYL) tablet 4 mg, 4 mg, Oral, BID, Sainani, Belia Heman, MD, 4 mg at 09/04/16 1001 .  guaiFENesin (MUCINEX) 12 hr tablet 600 mg, 600 mg, Oral, BID PRN, Henreitta Leber, MD, 600 mg at 09/04/16 1000 .  insulin aspart (novoLOG) injection 0-15 Units, 0-15 Units, Subcutaneous, TID WC, Hillary Bow, MD, 3 Units at 09/04/16 1219 .  insulin aspart (novoLOG) injection 0-5 Units, 0-5 Units, Subcutaneous, QHS, Hillary Bow, MD, 5 Units at 09/03/16 2105 .  ipratropium-albuterol (DUONEB) 0.5-2.5 (3) MG/3ML nebulizer solution 3 mL, 3 mL, Nebulization, Q6H, Ashby Dawes, Gwendolyn Nishi, MD, 3 mL at 09/04/16 0747 .  linagliptin (TRADJENTA) tablet 5 mg, 5 mg, Oral, Daily, 5 mg at 09/04/16 1001 **AND** [DISCONTINUED] metFORMIN (GLUCOPHAGE) tablet 1,000 mg, 1,000 mg, Oral, Q breakfast, Sainani, Vivek J, MD, 1,000 mg at 09/04/16 1001 .  metoprolol succinate (TOPROL-XL) 24 hr tablet 12.5 mg, 12.5 mg, Oral, Daily, Sudini, Srikar, MD, 12.5 mg at 09/04/16 1000 .  mirtazapine (REMERON) tablet 15 mg, 15 mg, Oral, QHS, Sainani, Belia Heman, MD, 15 mg at 09/03/16 2104 .  morphine CONCENTRATE 10 MG/0.5ML oral solution 2.6 mg, 2.6 mg, Sublingual, Q3H PRN, Basilio Cairo, NP .  multivitamin with minerals tablet 1 tablet, 1 tablet, Oral, Daily, Sudini, Srikar, MD, 1 tablet at 09/04/16 1000 .  ondansetron (ZOFRAN)  tablet 4 mg, 4 mg, Oral, Q6H PRN **OR** ondansetron (ZOFRAN) injection 4 mg, 4 mg, Intravenous, Q6H PRN, Sainani, Vivek J, MD .  pantoprazole (PROTONIX) EC tablet 40 mg, 40 mg, Oral, Daily, Sainani, Belia Heman, MD, 40 mg at 09/04/16 1000 .  piperacillin-tazobactam (ZOSYN) IVPB 3.375 g, 3.375 g, Intravenous, Q8H, Hillary Bow, MD, Stopped at 09/04/16 1400 .  predniSONE (DELTASONE) tablet 50 mg, 50 mg, Oral, Q breakfast, Sudini, Srikar, MD, 50 mg at 09/04/16 1000 .  sodium chloride flush (NS) 0.9 % injection 10-40 mL, 10-40 mL, Intracatheter, PRN, Hillary Bow, MD   Allergies:  Patient has no known allergies.  Review of Systems: Gen:  Denies  fever, sweats, chills HEENT: Denies blurred vision, double vision. bleeds, sore throat Cvc:  No dizziness, chest pain. Resp:   Denies cough or sputum production, shortness of breath Gi: Denies swallowing difficulty, stomach pain. Gu:  Denies bladder incontinence, burning urine Ext:   No Joint pain, stiffness. Skin: No skin rash,  hives  Endoc:  No polyuria, polydipsia. Psych: No depression, insomnia. Other:  All other systems were reviewed with the patient and were negative other that what is mentioned in the HPI.   Physical Examination:   VS: BP 112/61 (BP Location: Right Arm)   Pulse (!) 134   Temp 97.7 F (36.5 C) (Oral)   Resp 18   Ht 5\' 7"  (1.702 m)   Wt 108 lb (49 kg)   SpO2 98%   BMI 16.92 kg/m   General Appearance: No distress, looks less dyspneic than yesterday.  Neuro:without focal findings,  speech normal,  HEENT: PERRLA, EOM intact.   Pulmonary: normal breath sounds, No wheezing. Decreased air entry bilaterally.  CardiovascularNormal S1,S2.  No m/r/g.   Abdomen: Benign, Soft, non-tender. Renal:  No costovertebral tenderness  GU:  No performed at this time. Endoc: No evident thyromegaly, no signs of acromegaly. Skin:   warm, no rashes, no ecchymosis  Extremities: normal, no cyanosis, clubbing.  Other findings:     LABORATORY PANEL:   CBC  Recent Labs Lab 09/02/16 0423  WBC 12.5*  HGB 9.6*  HCT 29.5*  PLT 275   ------------------------------------------------------------------------------------------------------------------  Chemistries   Recent Labs Lab 09/02/16 0423  NA 140  K 4.1  CL 103  CO2 31  GLUCOSE 187*  BUN 19  CREATININE 0.58*  CALCIUM 8.6*   ------------------------------------------------------------------------------------------------------------------  Cardiac Enzymes  Recent Labs Lab 09/01/16 1502  TROPONINI <0.03   ------------------------------------------------------------  RADIOLOGY:  Ct Angio Chest Pe W Or Wo Contrast  Result Date: 09/03/2016 CLINICAL DATA:  Shortness of Breath EXAM: CT ANGIOGRAPHY CHEST WITH CONTRAST TECHNIQUE: Multidetector CT imaging of the chest was performed using the standard protocol during bolus administration of intravenous contrast. Multiplanar CT image reconstructions and MIPs were obtained to evaluate the vascular anatomy. CONTRAST:  75 mL Isovue 370. COMPARISON:  12/24/2013 FINDINGS: Cardiovascular: Thoracic aorta again demonstrates atherosclerotic calcifications without aneurysmal dilatation or dissection. The brachiocephalic vessels are well visualized. Multifocal stenoses of the left common carotid artery are seen which have progressed in the interval from the prior exam. At least one of these appears pre occlusive. Pulmonary  artery demonstrates postsurgical changes on the left consistent with the known lobectomy. No filling defect to suggest pulmonary embolism is seen. No significant cardiac enlargement is noted. Coronary calcifications are seen. Mediastinum/Nodes: The thoracic inlet is within normal limits. No significant hilar or mediastinal adenopathy is noted. Some thickening of the distal esophagus is seen of uncertain significance. This was present on the prior exam and may be related to reflux. Lungs/Pleura: Diffuse  emphysematous changes are seen. Scattered nodular changes are noted in the right upper lobe best seen on image number 41 of series 6 and in the middle lobe on image number 51 of series 6. The largest of these measures approximately 9 mm and was not present on the prior CT examination. Pleural based density on the left in the upper lobe is noted which measures approximately 14 mm. Some scattered smaller nodules are noted throughout the residual left lung. One of these measures approximately 8 mm best seen on image number 49 of series 6. Scarring related to the prior lobectomy is noted. Moderate size right-sided pleural effusion is noted with air within. The areas likely related to the recent thoracentesis. Right lower lobe atelectasis is seen. Upper Abdomen: Visualized upper abdomen shows enlargement of the right adrenal gland likely related to metastatic disease. This measures approximately 3 cm in greatest dimension. Musculoskeletal: Degenerative changes of the thoracic spine are noted. No definitive acute bony abnormality is noted. Review of the MIP images confirms the above findings. IMPRESSION: Changes consistent with left lower lobectomy. There are some nodular changes in the left upper lobe and residual portion of the left lower lobe which may be postinflammatory in nature. Correlation with recent CT would be helpful as these were not present on the prior exam from 2015. If any more recent CTs could be made available they could be compared. Right adrenal lesion likely representing metastatic disease. Changes consistent with recent right-sided thoracentesis with air within the pleural fluid. Nodular changes on the right which would also be helpful to compare with more recent exams. No evidence of pulmonary embolism. Aortic Atherosclerosis (ICD10-I70.0) and Emphysema (ICD10-J43.9). Electronically Signed   By: Inez Catalina M.D.   On: 09/03/2016 17:51       Thank  you for the consultation and for allowing  [facility] to assist in the care of your patient. Our recommendations are noted above.  Please contact us if we can be of further service.   Marda Stalker, MD.  Board Certified in Internal Medicine, Pulmonary Medicine, [place], and Sleep Medicine.  [facility] Office Number: [phone]  Patricia Pesa, M.D.  Merton Border, M.D  09/04/2016

## 2016-09-04 NOTE — Progress Notes (Signed)
New referral for Home Palliative to follow received from [place]. Referral made aware. Thank you. Flo Shanks RN, BSN, [facility] [place], hospital Liaison [phone]

## 2016-09-04 NOTE — Progress Notes (Signed)
[facility] [place] NAME: Justin Wade    MR#:  [id number]  DATE OF BIRTH:  09-27-1932  SUBJECTIVE:  CHIEF COMPLAINT:   Chief Complaint  Patient presents with  . Shortness of Breath   Still has SOB and weakness  REVIEW OF SYSTEMS:    Review of Systems  Constitutional: Positive for malaise/fatigue. Negative for chills and fever.  HENT: Negative for sore throat.   Eyes: Negative for blurred vision, double vision and pain.  Respiratory: Positive for cough and shortness of breath. Negative for hemoptysis and wheezing.   Cardiovascular: Negative for chest pain, palpitations, orthopnea and leg swelling.  Gastrointestinal: Negative for abdominal pain, constipation, diarrhea, heartburn, nausea and vomiting.  Genitourinary: Negative for dysuria and hematuria.  Musculoskeletal: Negative for back pain and joint pain.  Skin: Negative for rash.  Neurological: Positive for weakness. Negative for sensory change, speech change, focal weakness and headaches.  Endo/Heme/Allergies: Does not bruise/bleed easily.  Psychiatric/Behavioral: Negative for depression. The patient is not nervous/anxious.     DRUG ALLERGIES:  No Known Allergies  VITALS:  Blood pressure 112/61, pulse (!) 134, temperature 97.7 F (36.5 C), temperature source Oral, resp. rate 18, height 5\' 7"  (1.702 m), weight 49 kg (108 lb), SpO2 98 %.  PHYSICAL EXAMINATION:   Physical Exam  GENERAL:  83 y.o.-year-old patient lying in the bed with conversational dyspnea EYES: Pupils equal, round, reactive to light and accommodation. No scleral icterus. Extraocular muscles intact.  HEENT: Head atraumatic, normocephalic. Oropharynx and nasopharynx clear.  NECK:  Supple, no jugular venous distention. No thyroid enlargement, no tenderness.  LUNGS: Normal breath sounds bilaterally, no wheezing, rales, rhonchi. No use of accessory muscles of respiration.  CARDIOVASCULAR: S1, S2 normal. No  murmurs, rubs, or gallops.  ABDOMEN: Soft, nontender, nondistended. Bowel sounds present. No organomegaly or mass.  EXTREMITIES: No cyanosis, clubbing or edema b/l.    NEUROLOGIC: Cranial nerves II through XII are intact. No focal Motor or sensory deficits b/l.   PSYCHIATRIC: The patient is alert and oriented x 3.  SKIN: No obvious rash, lesion, or ulcer.   LABORATORY PANEL:   CBC  Recent Labs Lab 09/02/16 0423  WBC 12.5*  HGB 9.6*  HCT 29.5*  PLT 275   ------------------------------------------------------------------------------------------------------------------ Chemistries   Recent Labs Lab 09/02/16 0423  NA 140  K 4.1  CL 103  CO2 31  GLUCOSE 187*  BUN 19  CREATININE 0.58*  CALCIUM 8.6*   ------------------------------------------------------------------------------------------------------------------  Cardiac Enzymes  Recent Labs Lab 09/01/16 1502  TROPONINI <0.03   ------------------------------------------------------------------------------------------------------------- RADIOLOGY:  Ct Angio Chest Pe W Or Wo Contrast  Result Date: 09/03/2016 CLINICAL DATA:  Shortness of Breath EXAM: CT ANGIOGRAPHY CHEST WITH CONTRAST TECHNIQUE: Multidetector CT imaging of the chest was performed using the standard protocol during bolus administration of intravenous contrast. Multiplanar CT image reconstructions and MIPs were obtained to evaluate the vascular anatomy. CONTRAST:  75 mL Isovue 370. COMPARISON:  12/24/2013 FINDINGS: Cardiovascular: Thoracic aorta again demonstrates atherosclerotic calcifications without aneurysmal dilatation or dissection. The brachiocephalic vessels are well visualized. Multifocal stenoses of the left common carotid artery are seen which have progressed in the interval from the prior exam. At least one of these appears pre occlusive. Pulmonary artery demonstrates postsurgical changes on the left consistent with the known lobectomy. No filling  defect to suggest pulmonary embolism is seen. No significant cardiac enlargement is noted. Coronary calcifications are seen. Mediastinum/Nodes: The thoracic inlet is within normal limits. No significant hilar or  mediastinal adenopathy is noted. Some thickening of the distal esophagus is seen of uncertain significance. This was present on the prior exam and may be related to reflux. Lungs/Pleura: Diffuse emphysematous changes are seen. Scattered nodular changes are noted in the right upper lobe best seen on image number 41 of series 6 and in the middle lobe on image number 51 of series 6. The largest of these measures approximately 9 mm and was not present on the prior CT examination. Pleural based density on the left in the upper lobe is noted which measures approximately 14 mm. Some scattered smaller nodules are noted throughout the residual left lung. One of these measures approximately 8 mm best seen on image number 49 of series 6. Scarring related to the prior lobectomy is noted. Moderate size right-sided pleural effusion is noted with air within. The areas likely related to the recent thoracentesis. Right lower lobe atelectasis is seen. Upper Abdomen: Visualized upper abdomen shows enlargement of the right adrenal gland likely related to metastatic disease. This measures approximately 3 cm in greatest dimension. Musculoskeletal: Degenerative changes of the thoracic spine are noted. No definitive acute bony abnormality is noted. Review of the MIP images confirms the above findings. IMPRESSION: Changes consistent with left lower lobectomy. There are some nodular changes in the left upper lobe and residual portion of the left lower lobe which may be postinflammatory in nature. Correlation with recent CT would be helpful as these were not present on the prior exam from 2015. If any more recent CTs could be made available they could be compared. Right adrenal lesion likely representing metastatic disease. Changes  consistent with recent right-sided thoracentesis with air within the pleural fluid. Nodular changes on the right which would also be helpful to compare with more recent exams. No evidence of pulmonary embolism. Aortic Atherosclerosis (ICD10-I70.0) and Emphysema (ICD10-J43.9). Electronically Signed   By: Inez Catalina M.D.   On: 09/03/2016 17:51   ASSESSMENT AND PLAN:   83 year old male with past medical history of hypertension, diabetes, lung cancer currently undergoing chemotherapy presents to the hospital due to shortness of breath.  * Acute respiratory failure with hypoxia-secondary to pleural effusion and advanced COPD  patient is status post left thoracentesis with 300 ML hazy fluid removed. Underlying pneumonia likely. ON IV abx Loculated effusion. Not amenable to chest tube per ONC/Pulm. Likely d/c homw tomorrow on Augmentin  * Diabetes type 2 , uncontrolled Improved after stopping IV steroids SSI  * Essential hypertension-continue metoprolol.  * Hyperlipidemia-continue atorvastatin.  * Hx of Lung cancer - currently ongoing chemo. Follows at [facility]. Overall poor prognosis  Wants to transfer care to [facility] and Dr. Tish Men to follow  All the records are reviewed and case discussed with Care Management/Social Worker Management plans discussed with the patient, family and they are in agreement.  CODE STATUS: DNR  DVT Prophylaxis: SCDs  TOTAL TIME TAKING CARE OF THIS PATIENT: 30 minutes.   POSSIBLE D/C IN 1-2 DAYS, DEPENDING ON CLINICAL CONDITION.  Hillary Bow R M.D on 09/04/2016 at 2:27 PM  Between 7am to 6pm - Pager - [phone]  After 6pm go to [email or web address] - password [email or web address] [facility]  Office  [phone]  CC: Primary care physician; Idelle Crouch, MD  Note: This dictation was prepared with Dragon dictation along with smaller phrase technology. Any transcriptional errors that result from this process are unintentional.

## 2016-09-04 NOTE — Telephone Encounter (Signed)
Message sent to scheduling. Order entered to add to tumor board for 8/23.

## 2016-09-05 DIAGNOSIS — J449 Chronic obstructive pulmonary disease, unspecified: Secondary | ICD-10-CM

## 2016-09-05 DIAGNOSIS — R05 Cough: Secondary | ICD-10-CM

## 2016-09-05 LAB — GLUCOSE, CAPILLARY
GLUCOSE-CAPILLARY: 51 mg/dL — AB (ref 65–99)
Glucose-Capillary: 80 mg/dL (ref 65–99)
Glucose-Capillary: 264 mg/dL — ABNORMAL HIGH (ref 65–99)

## 2016-09-05 MED ORDER — GLIMEPIRIDE 4 MG PO TABS: 2.0000 mg | ORAL_TABLET | Freq: Two times a day (BID) | ORAL | Status: AC

## 2016-09-05 MED ORDER — ENSURE ENLIVE PO LIQD: 237.0000 mL | Freq: Two times a day (BID) | ORAL | 0 refills | Status: AC

## 2016-09-05 MED ORDER — PREDNISONE 20 MG PO TABS: 40.0000 mg | ORAL_TABLET | Freq: Every day | ORAL | 0 refills | Status: DC

## 2016-09-05 MED ORDER — AMOXICILLIN-POT CLAVULANATE 875-125 MG PO TABS: 1.0000 | ORAL_TABLET | Freq: Two times a day (BID) | ORAL | 0 refills | Status: AC

## 2016-09-05 NOTE — Progress Notes (Signed)
[name].   DOB:Apr 17, 1932   SV#:[id number]    Subjective:  He is up in the bed; on nasal cannula oxygen. He is having his breakfast. Overall shortness of breath is improved. He still gets short of breath on exertion. Cough improved.  ROS: Denies any chest pain. Denies any nausea vomiting diarrhea or constipation.  Objective:  Vitals:   09/05/16 0755 09/05/16 1305  BP: 110/61 112/60  Pulse: (!) 101 98  Resp: (!) 22 20  Temp: 97.9 F (36.6 C) 98.6 F (37 C)  SpO2: 100% 100%     Intake/Output Summary (Last 24 hours) at 09/05/16 2304 Last data filed at 09/05/16 1200  Gross per 24 hour  Intake              410 ml  Output              200 ml  Net              210 ml    GENERAL: Thin built cachectic-appearing Caucasian male patient Alert, mild respiratory distress.  Alone. He is on 2 L nasal cannula EYES: no pallor or icterus OROPHARYNX: no thrush or ulceration. NECK: supple, no masses felt LYMPH:  no palpable lymphadenopathy in the cervical, axillary or inguinal regions LUNGS: decreased breath sounds to auscultation at bases and  No wheeze or crackles HEART/CVS: regular rate & rhythm and no murmurs; No lower extremity edema ABDOMEN: abdomen soft, non-tender and normal bowel sounds Musculoskeletal:no cyanosis of digits and no clubbing  PSYCH: alert & oriented x 3 with fluent speech NEURO: no focal motor/sensory deficits SKIN:  no rashes or significant lesions   Labs:  Lab Results  Component Value Date   WBC 12.5 (H) 09/02/2016   HGB 9.6 (L) 09/02/2016   HCT 29.5 (L) 09/02/2016   MCV 82.1 09/02/2016   PLT 275 09/02/2016   NEUTROABS 6.1 12/27/2013    Lab Results  Component Value Date   NA 140 09/02/2016   K 4.1 09/02/2016   CL 103 09/02/2016   CO2 31 09/02/2016    Studies:  No results found.  Assessment & Plan:   # 83 year old male patient with history of recurrent/ Metastatic adenocarcinoma of the lung currently on maintenance therapy with Alimta- is  currently admitted to the hospital for worsening shortness of breath/cough/right-sided pleural effusion.  # Recurrent metastatic adenocarcinoma the lung- on Alimta. Question progression given the worsening effusion. CTA-no PE;  shows loculated small effusion on the right side; severe COPD; pleural nodularity [question malignancy versus inflammation]; but no overwhelming disease burden in the lungs. Will review the images from [facility].   # shortness of breath/ right-sided pleural effusion- status post thoracentesis. Likely secondary to severe COPD. discussed with Dr.Ram; given loculated pleural effusion- not much role for Pleurx catheter. Overall improvement noted. Patient to be discharged on home oxygen/by mouth antibiotics.  # Elevated blood sugars- diabetes/ on steroids- on insulin.  # Borderline performance status- secondary to comorbidities/recurrent malignancy. S/p evaluation by palliative care provider- Ms.Ihor Dow.   # Patient to follow up with me in 1 week; we will review the plan of care- likely plan immunotherapy. Discussed with Lung Navigator, Hayley.   Cammie Sickle, MD 09/05/2016  11:04 PM

## 2016-09-05 NOTE — Discharge Instructions (Signed)
Resume diet and activity as before ° ° °

## 2016-09-05 NOTE — Progress Notes (Signed)
SATURATION QUALIFICATIONS: (This note is used to comply with regulatory documentation for home oxygen)  Patient Saturations on Room Air at Rest = 88%  Patient Saturations on Room Air while Ambulating = 84%  Patient Saturations on 2 Liters of oxygen while Ambulating = 89%  Please briefly explain why patient needs home oxygen: COPD, Dyspnea

## 2016-09-05 NOTE — Progress Notes (Signed)
Inpatient Diabetes Program Recommendations  AACE/ADA: New Consensus Statement on Inpatient Glycemic Control (2015)  Target Ranges:  Prepandial:   less than 140 mg/dL      Peak postprandial:   less than 180 mg/dL (1-2 hours)      Critically ill patients:  140 - 180 mg/dL   Lab Results  Component Value Date   GLUCAP 80 09/05/2016    Review of Glycemic Control  Results for Justin, Wade (MRN [id number]) as of 09/05/2016 08:44  Ref. Range 09/04/2016 12:15 09/04/2016 16:47 09/04/2016 21:49 09/05/2016 07:49 09/05/2016 08:03  Glucose-Capillary Latest Ref Range: 65 - 99 mg/dL 162 (H) 312 (H) 323 (H) 51 (L) 80   Diabetes history: Type 2 Outpatient Diabetes medications: Amaryl 4mg  bid, Janumet 100/1000mg  qday  Current orders for Inpatient glycemic control: Novolog 0-5 units qhs, Novolog 0-15units tid, Tradjenta 5mg  q day,  Amaryl 4mg  qday  * Prednisone 50mg  qam  Inpatient Diabetes Program Recommendations: Hypoglycemia this am- Please d/c Amaryl - patient weighs 50lbs, is 83 years old and is sometimes only taking in 50% of his meals.  To control daytime hypoglycemia, consider Levemir 5 units qam at 8am.   Discussed recommendations with RN Nelly Rout, RN, BA, MHA, CDE Diabetes Coordinator Inpatient Diabetes Program  [phone] (Team Pager) [phone] ([place]) 09/05/2016 8:46 AM

## 2016-09-05 NOTE — Care Management (Signed)
Discharge to home today per Dr. Darvin Neighbours. Home oxygen, rolling walker, and possibly shower chair will be provided by [facility]. [facility] will be providing skilled nursing and physical therapy  Wife will transport Shelbie Ammons RN MSN [place] [facility] [phone]

## 2016-09-05 NOTE — Evaluation (Signed)
Occupational Therapy Evaluation Patient Details Name: Justin Wade. MRN: [id number] DOB: [birth year] Today's Date: 09/05/2016    History of Present Illness Justin Wade is a 83 y.o. male with a known history of cancer, diabetes, hypertension who presents to the hospital due to shortness of breath. Patient has a history of lung cancer, is extensively followed at [facility], and has ongoing chemotherapy. His last dose was over a month ago. He now presents with shortness of breath even at minimal exertion and also at rest. Patient admits to a cough which is productive with yellow sputum but no fevers, chills, nausea, vomiting, abdominal pain or any other associated symptoms. Since his shortness of breath was progressively getting worse he came to the ER for further evaluation and underwent a chest x-ray which showed a moderately sized right-sided pleural effusion. Hospitalist services were contacted further treatment and evaluation. Pt currently admitted for acute respiratory failure with hypoxia secondary to pleural effusion and advanced COPD.    Clinical Impression   Patient seen for OT evaluation this date was previously independent with self care tasks except for the last few weeks in which his wife has had to help him with self care tasks.  Patient currently requires minimal assist for lower body bathing and dressing skills.  He was instructed on energy conservation techniques as well as adaptive equipment options for lower body dressing and is able to demonstrate with verbal cues and set up.  Patient would benefit from skilled OT services to maximize his safety and independence in daily tasks.  Do not anticipate follow up OT at discharge.     Follow Up Recommendations  No OT follow up    Equipment Recommendations  Other (comment) (grab bars in shower and by toilet)    Recommendations for Other Services       Precautions / Restrictions Precautions Precautions: None Restrictions Weight  Bearing Restrictions: No      Mobility Bed Mobility Overal bed mobility: Independent             General bed mobility comments: Increased time but functional. HOB flat and no bed rails  Transfers Overall transfer level: Needs assistance Equipment used: None Transfers: Sit to/from Stand Sit to Stand: Supervision         General transfer comment: Pt reports he feels the need for a walker to go outside at home.     Balance Overall balance assessment: Needs assistance Sitting-balance support: No upper extremity supported Sitting balance-Leahy Scale: Good     Standing balance support: No upper extremity supported Standing balance-Leahy Scale: Fair                             ADL either performed or assessed with clinical judgement   ADL Overall ADL's : Needs assistance/impaired Eating/Feeding: Modified independent   Grooming: Sitting;Modified independent   Upper Body Bathing: Modified independent   Lower Body Bathing: Minimal assistance   Upper Body Dressing : Modified independent   Lower Body Dressing: Minimal assistance   Toilet Transfer: Supervision/safety   Toileting- Clothing Manipulation and Hygiene: Min guard       Functional mobility during ADLs: Supervision/safety General ADL Comments: Patient and wife educated on energy conservation techniques for ADL/IADL tasks and issued handout with information for home.  Patient also instructed on use of reacher, sock aid, shoe horn and sponge for basic self care tasks for energy conservation and avoiding bending and stooping which affect his breath  quality and volume.  Patient did not have oxygen at home prior but will have when he returns home.  Wife reports patient has been weaker and increased difficulty with breathing since his last chemo treatment on June 12.       Vision Patient Visual Report: No change from baseline       Perception     Praxis      Pertinent Vitals/Pain Pain Assessment:  No/denies pain     Hand Dominance Right   Extremity/Trunk Assessment Upper Extremity Assessment Upper Extremity Assessment: Overall WFL for tasks assessed   Lower Extremity Assessment Lower Extremity Assessment: Defer to PT evaluation   Cervical / Trunk Assessment Cervical / Trunk Assessment: Normal   Communication Communication Communication: No difficulties   Cognition Arousal/Alertness: Awake/alert Behavior During Therapy: WFL for tasks assessed/performed Overall Cognitive Status: Within Functional Limits for tasks assessed                                     General Comments       Exercises     Shoulder Instructions      Home Living Family/patient expects to be discharged to:: Private residence Living Arrangements: Spouse/significant other Available Help at Discharge: Family;Available 24 hours/day;Other (Comment) Type of Home: House Home Access: Stairs to enter [employer] of Steps: 2 Entrance Stairs-Rails: Right Home Layout: One level     Bathroom Shower/Tub: Occupational psychologist: Standard     Home Equipment: Grab bars - tub/shower;Shower seat;Hand held shower head          Prior Functioning/Environment Level of Independence: Needs assistance  Gait / Transfers Assistance Needed: Household ambulator without assistive device. No falls in the last 12 months ADL's / Homemaking Assistance Needed: Requiring assist from wife with bathing. Wife assists with IADLs in the last couple of months            OT Problem List: Decreased strength;Impaired balance (sitting and/or standing);Decreased activity tolerance;Decreased knowledge of use of DME or AE      OT Treatment/Interventions: Self-care/ADL training;DME and/or AE instruction;Therapeutic activities;Balance training;Therapeutic exercise;Energy conservation;Patient/family education    OT Goals(Current goals can be found in the care plan section) Acute Rehab OT  Goals Patient Stated Goal: To go home and be independent  OT Goal Formulation: With patient/family Time For Goal Achievement: 09/12/16 Potential to Achieve Goals: Good  OT Frequency: Min 1X/week   Barriers to D/C:            Co-evaluation              AM-PAC PT "6 Clicks" Daily Activity     Outcome Measure Help from another person eating meals?: None Help from another person taking care of personal grooming?: None Help from another person toileting, which includes using toliet, bedpan, or urinal?: A Little Help from another person bathing (including washing, rinsing, drying)?: A Little Help from another person to put on and taking off regular upper body clothing?: None Help from another person to put on and taking off regular lower body clothing?: A Little 6 Click Score: 21   End of Session Equipment Utilized During Treatment: Gait belt  Activity Tolerance: Patient tolerated treatment well Patient left: in bed;with family/visitor present;with call bell/phone within reach  OT Visit Diagnosis: Unsteadiness on feet (R26.81);Muscle weakness (generalized) (M62.81)                Time:  3220-2542 OT Time Calculation (min): 40 min Charges:  OT General Charges $OT Visit: 1 Procedure OT Evaluation $OT Eval Low Complexity: 1 Procedure OT Treatments $Self Care/Home Management : 8-22 mins G-Codes: OT G-codes **NOT FOR INPATIENT CLASS** Functional Assessment Tool Used: AM-PAC 6 Clicks Daily Activity Functional Limitation: Self care Self Care Current Status (H0623): At least 20 percent but less than 40 percent impaired, limited or restricted Self Care Goal Status (J6283): At least 1 percent but less than 20 percent impaired, limited or restricted   Amy T Lovett, OTR/L, CLT   Lovett,Amy 09/05/2016, 1:21 PM

## 2016-09-05 NOTE — Progress Notes (Signed)
Patient discharged home with wife. DME at bedside: walker, O2 portable tank. Discharge instructions reviewed. Patient aware of prescriptions and use. Port access removed without complications. Patient escorted to lobby for discharge by nurse and volunteer via w/c.

## 2016-09-06 LAB — BODY FLUID CULTURE: CULTURE: NO GROWTH

## 2016-09-08 NOTE — Discharge Summary (Signed)
[facility] NAME: Justin Wade    MR#:  [id number]  DATE OF BIRTH:  Oct 04, 1932  DATE OF ADMISSION:  09/01/2016 ADMITTING PHYSICIAN: Henreitta Leber, MD  DATE OF DISCHARGE: 09/05/2016  2:47 PM  PRIMARY CARE PHYSICIAN: Idelle Crouch, MD   ADMISSION DIAGNOSIS:  Shortness of breath [R06.02] Pleural effusion [J90] Hypoxia [R09.02] Dyspnea, unspecified type [R06.00]  DISCHARGE DIAGNOSIS:  Active Problems:   Acute respiratory failure with hypoxia (HCC)   Shortness of breath   Pleural effusion   Malignant neoplasm of lung ([place])   Palliative care by specialist   Goals of care, counseling/discussion   SECONDARY DIAGNOSIS:   Past Medical History:  Diagnosis Date  . Diabetes mellitus without complication ([place])   . Hypertension   . Lung cancer ([place])    ADMITTING HISTORY  HISTORY OF PRESENT ILLNESS:  Justin Wade  is a 83 y.o. male with a known history of Cancer, diabetes, hypertension who presents to the hospital due to shortness of breath. Patient has a history of lung cancer and is extensively followed up at [facility] and his ongoing chemotherapy and his last dose was over a month ago, now presents with shortness of breath even at minimal exertion and also at rest. Patient admits to a cough which is productive with yellow sputum but no fevers, chills, nausea, vomiting, abdominal pain or any other associated symptoms. Since his shortness of breath was progressively getting worse he came to the ER for further evaluation and underwent a chest x-ray which showed a moderately sized right-sided pleural effusion. Hospitalist services were contacted further treatment and evaluation  HOSPITAL COURSE:   83 year old male with past medical history of hypertension, diabetes, lung cancer currently undergoing chemotherapy presents to the hospital due to shortness of breath.  * Acute respiratory failure with hypoxia-secondary to pleural  effusion and advanced COPD  patient is status post left thoracentesis with 300 ML hazy fluid removed. Underlying pneumonia likely. ON IV abx as recommended by Dr. Devin Going Loculated effusion. Not amenable to chest tube per ONC/Pulm. Patient will be discharged home on Augmentin. Follow-up with pulmonary and oncology as outpatient. He will have palliative care following at home. Overall with recurrent and advanced cancer his prognosis seems poor. Continues to need oxygen at time of discharge and has been set up by case management.  * Diabetes type 2, uncontrolled Uncontrolled in the hospital due to IV steroids. Improved well 1 switch to prednisone. No change to home medications.  * Essential hypertension-continue metoprolol.  * Hyperlipidemia-continue atorvastatin.  * Hx of Lung cancer - currently ongoing chemo. Follows at [facility]. Overall poor prognosis  Wants to transfer care to [facility] and seen by Dr. Tish Men in the hospital  Patient discharged home in stable condition with home oxygen, home health and palliative care following.  CONSULTS OBTAINED:  Treatment Team:  Laverle Hobby, MD Cammie Sickle, MD  DRUG ALLERGIES:  No Known Allergies  DISCHARGE MEDICATIONS:   Discharge Medication List as of 09/05/2016  2:15 PM    START taking these medications   Details  amoxicillin-clavulanate (AUGMENTIN) 875-125 MG tablet Take 1 tablet by mouth 2 (two) times daily., Starting Fri 09/05/2016, Until Fri 09/12/2016, Normal    feeding supplement, ENSURE ENLIVE, (ENSURE ENLIVE) LIQD Take 237 mLs by mouth 2 (two) times daily between meals., Starting Fri 09/05/2016, Normal    predniSONE (DELTASONE) 20 MG tablet Take 2 tablets (40 mg total) by mouth daily., Starting Fri 09/05/2016, Until  Sat 09/05/2017, Normal      CONTINUE these medications which have CHANGED   Details  glimepiride (AMARYL) 4 MG tablet Take 0.5 tablets (2 mg total) by mouth 2 (two) times daily., Starting Fri  09/05/2016, No Print      CONTINUE these medications which have NOT CHANGED   Details  aspirin EC 81 MG tablet Take 81 mg by mouth daily., Historical Med    atorvastatin (LIPITOR) 20 MG tablet Take 1 tablet by mouth at bedtime., Starting Fri 03/14/2016, Historical Med    chlorpheniramine-HYDROcodone (TUSSIONEX) 10-8 MG/5ML SUER Take 5 mLs by mouth every 12 (twelve) hours as needed. , Starting Thu 11/29/2015, Historical Med    dexamethasone (DECADRON) 4 MG tablet Take 4 mg by mouth as needed., Historical Med    folic acid (FOLVITE) 425 MCG tablet Take 400 mcg by mouth daily. , Historical Med    guaiFENesin (MUCINEX) 600 MG 12 hr tablet Take 600 mg by mouth as needed. , Starting Tue 12/16/2011, Historical Med    metoprolol succinate (TOPROL-XL) 50 MG 24 hr tablet Take 50 mg by mouth daily. , Starting Tue 04/08/2016, Until Wed 04/08/2017, Historical Med    mirtazapine (REMERON) 15 MG tablet Take 15 mg by mouth., Starting Mon 06/09/2016, Until Tue 06/09/2017, Historical Med    omeprazole (PRILOSEC) 40 MG capsule Take 40 mg by mouth 2 (two) times daily. , Starting Tue 02/19/2016, Historical Med    SitaGLIPtin-MetFORMIN HCl (JANUMET XR) [phone] MG TB24 Take 1 tablet by mouth daily., Historical Med       Today   VITAL SIGNS:  Blood pressure 112/60, pulse 98, temperature 98.6 F (37 C), temperature source Oral, resp. rate 20, height 5\' 7"  (1.702 m), weight 49 kg (108 lb), SpO2 100 %.  I/O:  No intake or output data in the 24 hours ending 09/08/16 1508  PHYSICAL EXAMINATION:  Physical Exam  GENERAL:  83 y.o.-year-old patient lying in the bed with no acute distress.  LUNGS: Normal breath sounds bilaterally, no wheezing, rales,rhonchi or crepitation. No use of accessory muscles of respiration.  CARDIOVASCULAR: S1, S2 normal. No murmurs, rubs, or gallops.  ABDOMEN: Soft, non-tender, non-distended. Bowel sounds present. No organomegaly or mass.  NEUROLOGIC: Moves all 4  extremities. PSYCHIATRIC: The patient is alert and oriented x 3.  SKIN: No obvious rash, lesion, or ulcer.   DATA REVIEW:   CBC  Recent Labs Lab 09/02/16 0423  WBC 12.5*  HGB 9.6*  HCT 29.5*  PLT 275    Chemistries   Recent Labs Lab 09/02/16 0423  NA 140  K 4.1  CL 103  CO2 31  GLUCOSE 187*  BUN 19  CREATININE 0.58*  CALCIUM 8.6*    Cardiac Enzymes No results for input(s): TROPONINI in the last 168 hours.  Microbiology Results  Results for orders placed or performed during the hospital encounter of 09/01/16  Body fluid culture     Status: None   Collection Time: 09/02/16 12:11 PM  Result Value Ref Range Status   Specimen Description PLEURAL  Final   Special Requests NONE  Final   Gram Stain   Final    FEW WBC PRESENT, PREDOMINANTLY MONONUCLEAR NO ORGANISMS SEEN    Culture   Final    NO GROWTH 3 DAYS Performed at [facility], [place] [address] [place], [place] [address]    Report Status 09/06/2016 FINAL  Final    RADIOLOGY:  No results found.  Follow up with PCP in 1 week.  Management plans discussed with the patient, family and they are in agreement.  CODE STATUS:  Code Status History    Date Active Date Inactive Code Status Order ID Comments User Context   09/02/2016  9:36 AM 09/05/2016  6:25 PM DNR [id number]  Hillary Bow, MD Inpatient   09/01/2016  8:41 PM 09/02/2016  9:36 AM Full Code [id number]  Henreitta Leber, MD Inpatient    Questions for Most Recent Historical Code Status (Order [id number])    Question Answer Comment   In the event of cardiac or respiratory ARREST Do not call a "code blue"    In the event of cardiac or respiratory ARREST Do not perform Intubation, CPR, defibrillation or ACLS    In the event of cardiac or respiratory ARREST Use medication by any route, position, wound care, and other measures to relive pain and suffering. May use oxygen, suction and manual treatment of airway obstruction as needed for comfort.          Advance Directive Documentation     Most Recent Value  Type of Advance Directive  Living will, Healthcare Power of Attorney  Pre-existing out of facility DNR order (yellow form or pink MOST form)  -  "MOST" Form in Place?  -      TOTAL TIME TAKING CARE OF THIS PATIENT ON DAY OF DISCHARGE: more than 30 minutes.   Hillary Bow R M.D on 09/08/2016 at 3:08 PM  Between 7am to 6pm - Pager - [phone]  After 6pm go to [email or web address] - password [email or web address] [place] [facility]  Office  [phone]  CC: Primary care physician; Idelle Crouch, MD  Note: This dictation was prepared with Dragon dictation along with smaller phrase technology. Any transcriptional errors that result from this process are unintentional.

## 2016-09-10 ENCOUNTER — Ambulatory Visit
Admission: RE | Admit: 2016-09-10 | Discharge: 2016-09-10 | Disposition: A | Discharge status: Home / Self Care | Payer: Self-pay | Source: Ambulatory Visit | Attending: Internal Medicine | Admitting: Internal Medicine

## 2016-09-10 ENCOUNTER — Other Ambulatory Visit: Payer: Self-pay | Admitting: Internal Medicine

## 2016-09-10 DIAGNOSIS — R0602 Shortness of breath: Secondary | ICD-10-CM

## 2016-09-15 ENCOUNTER — Inpatient Hospital Stay: Payer: Medicare Other | Admitting: Internal Medicine

## 2016-09-15 NOTE — Progress Notes (Deleted)
[facility] NOTE  Patient Care Team: Idelle Crouch, MD as PCP - General (Internal Medicine)  CHIEF COMPLAINTS/PURPOSE OF CONSULTATION:  ***  #   No history exists.     HISTORY OF PRESENTING ILLNESS:  Justin Wade. 83 y.o.  male     ROS: A complete 10 point review of system is done which is negative except mentioned above in history of present illness  MEDICAL HISTORY:  Past Medical History:  Diagnosis Date  . Diabetes mellitus without complication ([place])   . Hypertension   . Lung cancer [facility])     SURGICAL HISTORY: Past Surgical History:  Procedure Laterality Date  . LUNG LOBECTOMY      SOCIAL HISTORY: Social History   Social History  . Marital status: Married    Spouse name: N/A  . Number of children: N/A  . Years of education: N/A   Occupational History  . Not on file.   Social History Main Topics  . Smoking status: Former Smoker    Packs/day: 0.50    Years: 35.00    Types: Cigarettes  . Smokeless tobacco: Never Used  . Alcohol use No  . Drug use: No  . Sexual activity: No   Other Topics Concern  . Not on file   Social History Narrative  . No narrative on file    FAMILY HISTORY: Family History  Problem Relation Age of Onset  . Diabetes Father   . Vascular Disease Father     ALLERGIES:  has No Known Allergies.  MEDICATIONS:  Current Outpatient Prescriptions  Medication Sig Dispense Refill  . aspirin EC 81 MG tablet Take 81 mg by mouth daily.    Marland Kitchen atorvastatin (LIPITOR) 20 MG tablet Take 1 tablet by mouth at bedtime.    . chlorpheniramine-HYDROcodone (TUSSIONEX) 10-8 MG/5ML SUER Take 5 mLs by mouth every 12 (twelve) hours as needed.     Marland Kitchen dexamethasone (DECADRON) 4 MG tablet Take 4 mg by mouth as needed.    . feeding supplement, ENSURE ENLIVE, (ENSURE ENLIVE) LIQD Take 237 mLs by mouth 2 (two) times daily between meals. 60 Bottle 0  . folic acid (FOLVITE) 542 MCG tablet Take 400 mcg by mouth daily.      Marland Kitchen glimepiride (AMARYL) 4 MG tablet Take 0.5 tablets (2 mg total) by mouth 2 (two) times daily.    Marland Kitchen guaiFENesin (MUCINEX) 600 MG 12 hr tablet Take 600 mg by mouth as needed.     . metoprolol succinate (TOPROL-XL) 50 MG 24 hr tablet Take 50 mg by mouth daily.     . mirtazapine (REMERON) 15 MG tablet Take 15 mg by mouth.    Marland Kitchen omeprazole (PRILOSEC) 40 MG capsule Take 40 mg by mouth 2 (two) times daily.     . predniSONE (DELTASONE) 20 MG tablet Take 2 tablets (40 mg total) by mouth daily. 6 tablet 0  . SitaGLIPtin-MetFORMIN HCl (JANUMET XR) [phone] MG TB24 Take 1 tablet by mouth daily.     No current facility-administered medications for this visit.       Marland Kitchen  PHYSICAL EXAMINATION: ECOG PERFORMANCE STATUS: {CHL ONC ECOG [phone]}  There were no vitals filed for this visit. There were no vitals filed for this visit.  GENERAL: Well-nourished well-developed; Alert, no distress and comfortable.   *** EYES: no pallor or icterus OROPHARYNX: no thrush or ulceration; good dentition  NECK: supple, no masses felt LYMPH:  no palpable lymphadenopathy in the cervical, axillary or inguinal regions  LUNGS: clear to auscultation and  No wheeze or crackles HEART/CVS: regular rate & rhythm and no murmurs; No lower extremity edema ABDOMEN: abdomen soft, non-tender and normal bowel sounds Musculoskeletal:no cyanosis of digits and no clubbing  PSYCH: alert & oriented x 3 with fluent speech NEURO: no focal motor/sensory deficits SKIN:  no rashes or significant lesions  LABORATORY DATA:  I have reviewed the data as listed Lab Results  Component Value Date   WBC 12.5 (H) 09/02/2016   HGB 9.6 (L) 09/02/2016   HCT 29.5 (L) 09/02/2016   MCV 82.1 09/02/2016   PLT 275 09/02/2016    Recent Labs  09/01/16 1502 09/02/16 0423  NA 135 140  K 4.7 4.1  CL 97* 103  CO2 27 31  GLUCOSE 546* 187*  BUN 24* 19  CREATININE 0.86 0.58*  CALCIUM 9.0 8.6*  GFRNONAA >60 >60  GFRAA >60 >60     RADIOGRAPHIC STUDIES: I have personally reviewed the radiological images as listed and agreed with the findings in the report. Dg Chest 1 View  Result Date: 09/02/2016 CLINICAL DATA:  Post right thoracentesis. EXAM: CHEST 1 VIEW COMPARISON:  09/01/2016 FINDINGS: There continues to be pleural-based densities in the lower chest bilaterally. Based on the recent ultrasound-guided thoracentesis, suspect that the pleural fluid is loculated. Negative for pneumothorax following the right thoracentesis. The pleural based disease has not significantly changed since 09/01/2016. Heart size is grossly stable. Stable appearance of the right jugular Port-A-Cath with tip at the cavoatrial junction. Again noted are ill-defined opacities in the periphery of the left lung and unclear if these represent pleural-based lesions. Again noted are surgical staples near the left lung apex. IMPRESSION: Minimal change in the bilateral pleural disease. Suspect this represents scarring and loculated pleural fluid. No evidence for a pneumothorax following the right thoracentesis. Persistent densities in the periphery of the left lung. Findings are nonspecific and could be better characterized with CT. Electronically Signed   By: Markus Daft M.D.   On: 09/02/2016 13:03   Dg Chest 2 View  Result Date: 09/01/2016 CLINICAL DATA:  Shortness of breath.  History of lung carcinoma. EXAM: CHEST  2 VIEW COMPARISON:  12/26/2013 FINDINGS: Stable positioning of right-sided Port-A-Cath. The heart size and mediastinal contours are within normal limits. New right pleural effusion of small to moderate volume. Left pleural fluid volume smaller than on the prior chest x-ray with relatively small effusion present. Vague irregular opacity in the peripheral aspect of the left upper lung may represent pleural or parenchymal opacity. Underlying chronic lung disease present with scattered areas of parenchymal scarring. No definite pneumonia or pulmonary  edema. No pneumothorax identified. The visualized skeletal structures are unremarkable. IMPRESSION: New right pleural effusion of small to moderate volume. Left pleural fluid volume appears smaller compared to a prior chest x-ray in 2015. Irregular opacity in the left lung may be of pleural or parenchymal origin. Cannot exclude an irregular pulmonary nodule in this region. Consider follow-up with CT of the chest unless there are recent outside CT studies of the chest. Electronically Signed   By: Aletta Edouard M.D.   On: 09/01/2016 17:39   Ct Angio Chest Pe W Or Wo Contrast  Addendum Date: 09/10/2016   ADDENDUM REPORT: 09/10/2016 12:15 ADDENDUM: Previous studies dated 04/08/2016 and 07/01/2016 are now available for comparison. The nodular changes seen in the right upper lobe on image number 41 of series 6 have regressed in the interval from the prior exam from 07/01/2016. The previously seen ground-glass  nodule within the posterior aspect of the right upper lobe is less well visualized on the current exam. The large pleural based density in the left upper lobe is stable in appearance from the prior exam. The scattered nodular changes in the more inferior aspect of the residual left lung are also stable from the recent exam. No new focal abnormality is noted. Electronically Signed   By: Inez Catalina M.D.   On: 09/10/2016 12:15   Result Date: 09/10/2016 CLINICAL DATA:  Shortness of Breath EXAM: CT ANGIOGRAPHY CHEST WITH CONTRAST TECHNIQUE: Multidetector CT imaging of the chest was performed using the standard protocol during bolus administration of intravenous contrast. Multiplanar CT image reconstructions and MIPs were obtained to evaluate the vascular anatomy. CONTRAST:  75 mL Isovue 370. COMPARISON:  12/24/2013 FINDINGS: Cardiovascular: Thoracic aorta again demonstrates atherosclerotic calcifications without aneurysmal dilatation or dissection. The brachiocephalic vessels are well visualized. Multifocal  stenoses of the left common carotid artery are seen which have progressed in the interval from the prior exam. At least one of these appears pre occlusive. Pulmonary artery demonstrates postsurgical changes on the left consistent with the known lobectomy. No filling defect to suggest pulmonary embolism is seen. No significant cardiac enlargement is noted. Coronary calcifications are seen. Mediastinum/Nodes: The thoracic inlet is within normal limits. No significant hilar or mediastinal adenopathy is noted. Some thickening of the distal esophagus is seen of uncertain significance. This was present on the prior exam and may be related to reflux. Lungs/Pleura: Diffuse emphysematous changes are seen. Scattered nodular changes are noted in the right upper lobe best seen on image number 41 of series 6 and in the middle lobe on image number 51 of series 6. The largest of these measures approximately 9 mm and was not present on the prior CT examination. Pleural based density on the left in the upper lobe is noted which measures approximately 14 mm. Some scattered smaller nodules are noted throughout the residual left lung. One of these measures approximately 8 mm best seen on image number 49 of series 6. Scarring related to the prior lobectomy is noted. Moderate size right-sided pleural effusion is noted with air within. The areas likely related to the recent thoracentesis. Right lower lobe atelectasis is seen. Upper Abdomen: Visualized upper abdomen shows enlargement of the right adrenal gland likely related to metastatic disease. This measures approximately 3 cm in greatest dimension. Musculoskeletal: Degenerative changes of the thoracic spine are noted. No definitive acute bony abnormality is noted. Review of the MIP images confirms the above findings. IMPRESSION: Changes consistent with left lower lobectomy. There are some nodular changes in the left upper lobe and residual portion of the left lower lobe which may be  postinflammatory in nature. Correlation with recent CT would be helpful as these were not present on the prior exam from 2015. If any more recent CTs could be made available they could be compared. Right adrenal lesion likely representing metastatic disease. Changes consistent with recent right-sided thoracentesis with air within the pleural fluid. Nodular changes on the right which would also be helpful to compare with more recent exams. No evidence of pulmonary embolism. Aortic Atherosclerosis (ICD10-I70.0) and Emphysema (ICD10-J43.9). Electronically Signed: By: Inez Catalina M.D. On: 09/03/2016 17:51   US Thoracentesis Asp Pleural Space W/img Guide  Result Date: 09/02/2016 INDICATION: Lung cancer, loculated right pleural effusion. Request made for diagnostic and therapeutic right thoracentesis. EXAM: ULTRASOUND GUIDED DIAGNOSTIC AND THERAPEUTIC RIGHT THORACENTESIS MEDICATIONS: None. COMPLICATIONS: None immediate. PROCEDURE: An ultrasound guided thoracentesis  was thoroughly discussed with the patient and questions answered. The benefits, risks, alternatives and complications were also discussed. The patient understands and wishes to proceed with the procedure. Written consent was obtained. Ultrasound was performed to localize and mark an adequate pocket of fluid in the right chest. The area was then prepped and draped in the normal sterile fashion. 1% Lidocaine was used for local anesthesia. Under ultrasound guidance a Safe-T-Centesis catheter was introduced. Thoracentesis was performed. The catheter was removed and a dressing applied. FINDINGS: A total of approximately 300 cc of slightly hazy, amber fluid was removed. Samples were sent to the laboratory as requested by the clinical team. IMPRESSION: Successful ultrasound guided diagnostic and therapeutic right thoracentesis yielding 300 cc of pleural fluid. Read by: Rowe Robert, PA-C Electronically Signed   By: Markus Daft M.D.   On: 09/02/2016 12:26     ASSESSMENT & PLAN:   No problem-specific Assessment & Plan notes found for this encounter.    All questions were answered. The patient knows to call the clinic with any problems, questions or concerns.       Cammie Sickle, MD 09/15/2016 8:29 AM

## 2016-09-16 ENCOUNTER — Encounter: Payer: Self-pay | Admitting: Internal Medicine

## 2016-09-16 ENCOUNTER — Telehealth: Payer: Self-pay | Admitting: *Deleted

## 2016-09-16 ENCOUNTER — Ambulatory Visit (INDEPENDENT_AMBULATORY_CARE_PROVIDER_SITE_OTHER): Payer: Medicare Other | Admitting: Internal Medicine

## 2016-09-16 VITALS — BP 124/72 | HR 121 | Ht 67.0 in | Wt 109.0 lb

## 2016-09-16 DIAGNOSIS — C3490 Malignant neoplasm of unspecified part of unspecified bronchus or lung: Secondary | ICD-10-CM | POA: Diagnosis not present

## 2016-09-16 DIAGNOSIS — J9601 Acute respiratory failure with hypoxia: Secondary | ICD-10-CM

## 2016-09-16 NOTE — Progress Notes (Signed)
* Justin Wade     Assessment and Plan:  The patient is an 83 year old male with severe COPD, stage IV non-small cell lung cancer, with multiple episodes of recurrence. He currently has advanced debility/deconditioning, and is no longer a candidate for chemotherapy. He was recently in the hospital with pneumonia, pleural effusion status post thoracentesis with short-term improvement. He presents back to the clinic today with recurrent respiratory failure and recurrence of the underlying pleural effusion area. We discussed palliative drainage of the pleural effusion, he understands that this may provide some short-term benefit, but the effusion would likely recur. Given the presence of loculations. It is unlikely that he would be a candidate for placement of a Pleurx catheter. He notes that he is very tired and does not want to undergo continued thoracentesis or medical procedures. He would prefer to be enrolled in hospice at this time. Advance care planning discussions, 20 minutes. -We'll place a consultation for at home hospice.  NSCLC, stage 4  Severe emphysema.  Pleural effusion.   Date: 09/16/2016  MRN# [id number] Justin Wade October 15, 1932   Justin Wade. is a 83 y.o. old male seen in follow up for chief complaint of  Chief Complaint  Patient presents with  . Hospitalization Follow-up    discharged 09/05/16- pt states breathing is baseline since discharge. pt reports of sob with exertion, mild prod cough with light yellow mucus, weakness & fatigue.      HPI:  The patient is an 83 yo male with a history of COPD, NSCLC, stage 4. He initially underwent a surgical resection (left lower lobectomy) on 01/2009. Developed local regional recurrence on 02/2011, underwent ChemoRT.  He developed bilateral lung and adrenal disease, diagnosed with stage 4 lung disease on 11/2011 and underwent chemo until 09/2013.  He was suspected of having bilateral pleural  disease, thoracentesis 01/15/16 was negative.  He was then noted to have no evidence of re accumulation of pleural fluid on 04/08/16.   The patient was recently discharged from the hospital on 09/05/2016 after a 4 day hospital admission. During that time he was found to have a right pleural effusion, and underwent a thoracentesis, with drainage of 300 mL. The fluid was highly loculated, therefore, all the fluid could not be drained. He continued to be dyspneic, he was treated empirically for pneumonia and showed improvement, subsequently discharged.Review of the fluid shows it is predominantly lymphocytic, cytology was negative, cultures negative.  Currently in the office, the patient looks gaunt, wasted, debilitated, weak, worn out, tired. He notes that simply coming to the office today completely "wore me out". We discussed the status of his cancer that he is no longer a candidate for chemotherapy and that he is getting tired. We discussed the possibility of trying to focus on quality of life rather than length, which would likely involve further hospital visits and continued decline. He tells me that he is ready to consider hospice.   A bedside chest ultrasound was performed today by me. Both lungs werr scanned in the posterior hemithorax from the base to the apex in the mid scapular line. There was minimal to no pleural effusion seen on the left lung. On the right side. There was a moderate pleural effusion which appeared to be loculated.   Medication:    Current Outpatient Prescriptions:  .  aspirin EC 81 MG tablet, Take 81 mg by mouth daily., Disp: , Rfl:  .  atorvastatin (LIPITOR) 20 MG tablet, Take 1  tablet by mouth at bedtime., Disp: , Rfl:  .  chlorpheniramine-HYDROcodone (TUSSIONEX) 10-8 MG/5ML SUER, Take 5 mLs by mouth every 12 (twelve) hours as needed. , Disp: , Rfl:  .  dexamethasone (DECADRON) 4 MG tablet, Take 4 mg by mouth as needed., Disp: , Rfl:  .  feeding supplement, ENSURE  ENLIVE, (ENSURE ENLIVE) LIQD, Take 237 mLs by mouth 2 (two) times daily between meals., Disp: 60 Bottle, Rfl: 0 .  folic acid (FOLVITE) 884 MCG tablet, Take 400 mcg by mouth daily. , Disp: , Rfl:  .  glimepiride (AMARYL) 4 MG tablet, Take 0.5 tablets (2 mg total) by mouth 2 (two) times daily., Disp: , Rfl:  .  guaiFENesin (MUCINEX) 600 MG 12 hr tablet, Take 600 mg by mouth as needed. , Disp: , Rfl:  .  metoprolol succinate (TOPROL-XL) 50 MG 24 hr tablet, Take 50 mg by mouth daily. , Disp: , Rfl:  .  mirtazapine (REMERON) 15 MG tablet, Take 15 mg by mouth., Disp: , Rfl:  .  omeprazole (PRILOSEC) 40 MG capsule, Take 40 mg by mouth 2 (two) times daily. , Disp: , Rfl:  .  predniSONE (DELTASONE) 20 MG tablet, Take 2 tablets (40 mg total) by mouth daily., Disp: 6 tablet, Rfl: 0 .  SitaGLIPtin-MetFORMIN HCl (JANUMET XR) [phone] MG TB24, Take 1 tablet by mouth daily., Disp: , Rfl:    Allergies:  Patient has no known allergies.  Review of Systems: Gen:  Denies  fever, sweats. HEENT: Denies blurred vision. Cvc:  No dizziness, chest pain or heaviness Resp:   Denies cough or sputum porduction. Gi: Denies swallowing difficulty, stomach pain. constipation, bowel incontinence Gu:  Denies bladder incontinence, burning urine Ext:   No Joint pain, stiffness. Skin: No skin rash, easy bruising. Endoc:  No polyuria, polydipsia. Psych: No depression, insomnia. Other:  All other systems were reviewed and found to be negative other than what is mentioned in the HPI.   Physical Examination:   VS: BP 124/72 (BP Location: Left Arm, Cuff Size: Normal)   Pulse (!) 121   Ht 5\' 7"  (1.702 m)   Wt 109 lb (49.4 kg) Comment: weight is per pt  SpO2 99%   BMI 17.07 kg/m    General Appearance: Appears dyspneic Neuro:without focal findings,  speech normal,  HEENT: PERRLA, EOM intact. Pulmonary: Decreased air entry bilaterally. CardiovascularNormal S1,S2.  No m/r/g.   Abdomen: Benign, Soft, non-tender. Renal:   No costovertebral tenderness  GU:  Not performed at this time. Endoc: No evident thyromegaly, no signs of acromegaly. Skin:   warm, no rash. Extremities: normal, no cyanosis, clubbing.   LABORATORY PANEL:   CBC No results for input(s): WBC, HGB, HCT, PLT in the last 168 hours. ------------------------------------------------------------------------------------------------------------------  Chemistries  No results for input(s): NA, K, CL, CO2, GLUCOSE, BUN, CREATININE, CALCIUM, MG, AST, ALT, ALKPHOS, BILITOT in the last 168 hours.  Invalid input(s): GFRCGP ------------------------------------------------------------------------------------------------------------------  Cardiac Enzymes No results for input(s): TROPONINI in the last 168 hours. ------------------------------------------------------------  RADIOLOGY:   No results found for this or any previous visit. Results for orders placed during the hospital encounter of 09/01/16  DG Chest 2 View   Narrative CLINICAL DATA:  Shortness of breath.  History of lung carcinoma.  EXAM: CHEST  2 VIEW  COMPARISON:  12/26/2013  FINDINGS: Stable positioning of right-sided Port-A-Cath. The heart size and mediastinal contours are within normal limits. New right pleural effusion of small to moderate volume. Left pleural fluid volume smaller than on the  prior chest x-ray with relatively small effusion present. Vague irregular opacity in the peripheral aspect of the left upper lung may represent pleural or parenchymal opacity. Underlying chronic lung disease present with scattered areas of parenchymal scarring. No definite pneumonia or pulmonary edema. No pneumothorax identified. The visualized skeletal structures are unremarkable.  IMPRESSION: New right pleural effusion of small to moderate volume. Left pleural fluid volume appears smaller compared to a prior chest x-ray in 2015. Irregular opacity in the left lung may be of  pleural or parenchymal origin. Cannot exclude an irregular pulmonary nodule in this region. Consider follow-up with CT of the chest unless there are recent outside CT studies of the chest.   Electronically Signed   By: Aletta Edouard M.D.   On: 09/01/2016 17:39    ------------------------------------------------------------------------------------------------------------------  Thank  you for allowing [facility] to assist in the care of your patient. Our recommendations are noted above.  Please contact us if we can be of further service.   Marda Stalker, MD.  [facility] Office Number: [phone]  Patricia Pesa, M.D.  Merton Border, M.D  09/16/2016

## 2016-09-16 NOTE — Addendum Note (Signed)
Addended by: Maryanna Shape A on: 09/16/2016 11:40 AM   Modules accepted: Orders

## 2016-09-16 NOTE — Telephone Encounter (Signed)
Orders written/faxed to hospice

## 2016-09-16 NOTE — Telephone Encounter (Signed)
Patient referred to hospice by Dr Ashby Dawes who said he is not going to be attending physician and they are asking if Dr B will be attending. If so, please fax orders etc to them

## 2016-09-17 ENCOUNTER — Telehealth: Payer: Self-pay | Admitting: *Deleted

## 2016-09-17 MED ORDER — LORAZEPAM 0.5 MG PO TABS: ORAL_TABLET | ORAL | 0 refills | Status: AC

## 2016-09-17 MED ORDER — MORPHINE SULFATE (CONCENTRATE) 20 MG/ML PO SOLN: ORAL | 0 refills | Status: AC

## 2016-09-17 NOTE — Telephone Encounter (Signed)
Rosanne Ashing opened patient to services today and feels he would benefit from Roxanol 0.25 - 1 ml every 1 hours as needed, and Lorazepam 0.5 mg  1 - 2 tabs every 4 hours as needed  To [facility] in [place] if in agreement and call her with details so she can profile it

## 2016-09-17 NOTE — Telephone Encounter (Signed)
OK per Dr B. Prescription faxed and Teressa informed

## 2016-09-18 ENCOUNTER — Telehealth: Payer: Self-pay | Admitting: *Deleted

## 2016-09-18 NOTE — Telephone Encounter (Signed)
Called pt to schedule follow up with Dr. Rogue Bussing. Pt stated that I would need to talk to his wife who is his primary caregiver. Spoke to his wife, Denice Paradise, and she stated that it is "nearly improssible" to bring him in for a follow up appt at this time and has refused to schedule an appt at this time. Denice Paradise stated that she would call back to reschedule appt if pt improves.

## 2016-10-07 NOTE — Progress Notes (Deleted)
* [facility]     Assessment and Plan:  The patient is an 83 year old male with severe COPD, stage IV non-small cell lung cancer, with multiple episodes of recurrence. He currently has advanced debility/deconditioning, and is no longer a candidate for chemotherapy, currently in hospice at home.   -We'll place a consultation for at home hospice.  NSCLC, stage 4  Severe emphysema.  Pleural effusion.   Date: 10/07/2016  MRN# [id number] Justin Wade 1932/06/07   Justin Wade. is a 83 y.o. old male seen in follow up for chief complaint of  No chief complaint on file.    HPI:  The patient is an 83 yo male with a history of COPD, NSCLC, stage 4. He initially underwent a surgical resection (left lower lobectomy) on 01/2009. Developed local regional recurrence on 02/2011, underwent ChemoRT.  He developed bilateral lung and adrenal disease, diagnosed with stage 4 lung disease on 11/2011 and underwent chemo until 09/2013.  He was suspected of having bilateral pleural disease, thoracentesis 01/15/16 was negative.   The patient was recently discharged from the hospital on 09/05/2016 after a 4 day hospital admission. During that time he was found to have a right pleural effusion, and underwent a thoracentesis, with drainage of 300 mL. The fluid was highly loculated, therefore, all the fluid could not be drained. He continued to be dyspneic, he was treated empirically for pneumonia and showed improvement, subsequently discharged.Review of the fluid shows it is predominantly lymphocytic, cytology was negative, cultures negative. He was last seen here in the office on 09/16/16. He was offered another thoracentesis, but declined stating that he was tired of all the interventions, and he opted for hospice, which was ordered.    Medication:    Current Outpatient Prescriptions:  .  aspirin EC 81 MG tablet, Take 81 mg by mouth daily., Disp: , Rfl:  .  atorvastatin  (LIPITOR) 20 MG tablet, Take 1 tablet by mouth at bedtime., Disp: , Rfl:  .  chlorpheniramine-HYDROcodone (TUSSIONEX) 10-8 MG/5ML SUER, Take 5 mLs by mouth every 12 (twelve) hours as needed. , Disp: , Rfl:  .  dexamethasone (DECADRON) 4 MG tablet, Take 4 mg by mouth as needed., Disp: , Rfl:  .  digoxin (LANOXIN) 0.125 MG tablet, Take by mouth., Disp: , Rfl:  .  feeding supplement, ENSURE ENLIVE, (ENSURE ENLIVE) LIQD, Take 237 mLs by mouth 2 (two) times daily between meals., Disp: 60 Bottle, Rfl: 0 .  folic acid (FOLVITE) 627 MCG tablet, Take 400 mcg by mouth daily. , Disp: , Rfl:  .  glimepiride (AMARYL) 4 MG tablet, Take 0.5 tablets (2 mg total) by mouth 2 (two) times daily., Disp: , Rfl:  .  guaiFENesin (MUCINEX) 600 MG 12 hr tablet, Take 600 mg by mouth as needed. , Disp: , Rfl:  .  LORazepam (ATIVAN) 0.5 MG tablet, 1 - 2 tabs every 4 hours prn, Disp: 30 tablet, Rfl: 0 .  metoprolol succinate (TOPROL-XL) 50 MG 24 hr tablet, Take 50 mg by mouth daily. , Disp: , Rfl:  .  mirtazapine (REMERON) 15 MG tablet, Take 15 mg by mouth., Disp: , Rfl:  .  morphine (ROXANOL) 20 MG/ML concentrated solution, 0.25 ml -1 ml (5 - 20 mg) every 1 - 2 hours as needed for pain or shortness of breath, Disp: 30 mL, Rfl: 0 .  omeprazole (PRILOSEC) 40 MG capsule, Take 40 mg by mouth 2 (two) times daily. , Disp: , Rfl:  .  SitaGLIPtin-MetFORMIN HCl (JANUMET XR) [phone] MG TB24, Take 1 tablet by mouth daily., Disp: , Rfl:    Allergies:  Patient has no known allergies.  Review of Systems: Gen:  Denies  fever, sweats. HEENT: Denies blurred vision. Cvc:  No dizziness, chest pain or heaviness Resp:   Denies cough or sputum porduction. Gi: Denies swallowing difficulty, stomach pain. constipation, bowel incontinence Gu:  Denies bladder incontinence, burning urine Ext:   No Joint pain, stiffness. Skin: No skin rash, easy bruising. Endoc:  No polyuria, polydipsia. Psych: No depression, insomnia. Other:  All other  systems were reviewed and found to be negative other than what is mentioned in the HPI.   Physical Examination:   VS: There were no vitals taken for this visit.   General Appearance: Appears dyspneic Neuro:without focal findings,  speech normal,  HEENT: PERRLA, EOM intact. Pulmonary: Decreased air entry bilaterally. CardiovascularNormal S1,S2.  No m/r/g.   Abdomen: Benign, Soft, non-tender. Renal:  No costovertebral tenderness  GU:  Not performed at this time. Endoc: No evident thyromegaly, no signs of acromegaly. Skin:   warm, no rash. Extremities: normal, no cyanosis, clubbing.   LABORATORY PANEL:   CBC No results for input(s): WBC, HGB, HCT, PLT in the last 168 hours. ------------------------------------------------------------------------------------------------------------------  Chemistries  No results for input(s): NA, K, CL, CO2, GLUCOSE, BUN, CREATININE, CALCIUM, MG, AST, ALT, ALKPHOS, BILITOT in the last 168 hours.  Invalid input(s): GFRCGP ------------------------------------------------------------------------------------------------------------------  Cardiac Enzymes No results for input(s): TROPONINI in the last 168 hours. ------------------------------------------------------------  RADIOLOGY:   No results found for this or any previous visit. Results for orders placed during the hospital encounter of 09/01/16  DG Chest 2 View   Narrative CLINICAL DATA:  Shortness of breath.  History of lung carcinoma.  EXAM: CHEST  2 VIEW  COMPARISON:  12/26/2013  FINDINGS: Stable positioning of right-sided Port-A-Cath. The heart size and mediastinal contours are within normal limits. New right pleural effusion of small to moderate volume. Left pleural fluid volume smaller than on the prior chest x-ray with relatively small effusion present. Vague irregular opacity in the peripheral aspect of the left upper lung may represent pleural or parenchymal  opacity. Underlying chronic lung disease present with scattered areas of parenchymal scarring. No definite pneumonia or pulmonary edema. No pneumothorax identified. The visualized skeletal structures are unremarkable.  IMPRESSION: New right pleural effusion of small to moderate volume. Left pleural fluid volume appears smaller compared to a prior chest x-ray in 2015. Irregular opacity in the left lung may be of pleural or parenchymal origin. Cannot exclude an irregular pulmonary nodule in this region. Consider follow-up with CT of the chest unless there are recent outside CT studies of the chest.   Electronically Signed   By: Aletta Edouard M.D.   On: 09/01/2016 17:39    ------------------------------------------------------------------------------------------------------------------  Thank  you for allowing [facility] to assist in the care of your patient. Our recommendations are noted above.  Please contact us if we can be of further service.   Marda Stalker, MD.  [facility] Office Number: [phone]  Patricia Pesa, M.D.  Merton Border, M.D  10/07/2016

## 2016-10-08 ENCOUNTER — Telehealth: Payer: Self-pay | Admitting: *Deleted

## 2016-10-08 NOTE — Telephone Encounter (Signed)
Called patient and spoke spouse. Patient is under hospice care and not able to come in tomorrow. I offered to refill any medications and she stated that none needed at this time. Appt for 10/09/16 will be cancelled. Nothing further needed.

## 2016-10-08 NOTE — Telephone Encounter (Signed)
Asking of patient can take Tussionex he has on hand from Dr Lonia Chimera at [facility] for a weak persistent cough. Please advise

## 2016-10-08 NOTE — Telephone Encounter (Signed)
Called to advise Rosanne Ashing that it is fine for patient to use his tussionex

## 2016-10-08 NOTE — Telephone Encounter (Signed)
-----   Message from Laverle Hobby, MD sent at 10/07/2016  5:56 PM EDT ----- Regarding: no need for follow up Looks like this pt is currently enrolled in hospice and it is difficult for him to get around. I am happy to see him, but if there are no particular needs he does not need to come in (we can call in any prescriptions).

## 2016-10-09 ENCOUNTER — Inpatient Hospital Stay: Payer: Medicare Other | Admitting: Internal Medicine

## 2016-10-20 ENCOUNTER — Telehealth: Payer: Self-pay | Admitting: *Deleted

## 2016-10-20 MED ORDER — LEVOFLOXACIN 500 MG PO TABS: 500.0000 mg | ORAL_TABLET | Freq: Every day | ORAL | 0 refills | Status: AC

## 2016-10-20 NOTE — Telephone Encounter (Signed)
Justin Wade called to report that patient has productive cough of yellow sputum and that he has scattered rhonchi throughout his lungs. Asking for antibiotics to be ordered. Please advise

## 2016-10-20 NOTE — Telephone Encounter (Signed)
Levaquin 500 mg daily times 5 days

## 2016-10-31 ENCOUNTER — Encounter: Payer: Self-pay | Admitting: Emergency Medicine

## 2016-10-31 ENCOUNTER — Emergency Department
Admission: EM | Admit: 2016-10-31 | Discharge: 2016-10-31 | Disposition: A | Discharge status: Home / Self Care | Payer: Medicare Other | Attending: Emergency Medicine | Admitting: Emergency Medicine

## 2016-10-31 DIAGNOSIS — Z87891 Personal history of nicotine dependence: Secondary | ICD-10-CM | POA: Insufficient documentation

## 2016-10-31 DIAGNOSIS — E119 Type 2 diabetes mellitus without complications: Secondary | ICD-10-CM | POA: Insufficient documentation

## 2016-10-31 DIAGNOSIS — C342 Malignant neoplasm of middle lobe, bronchus or lung: Secondary | ICD-10-CM | POA: Insufficient documentation

## 2016-10-31 DIAGNOSIS — Z79899 Other long term (current) drug therapy: Secondary | ICD-10-CM | POA: Insufficient documentation

## 2016-10-31 DIAGNOSIS — T5891XA Toxic effect of carbon monoxide from unspecified source, accidental (unintentional), initial encounter: Secondary | ICD-10-CM | POA: Diagnosis not present

## 2016-10-31 DIAGNOSIS — Z7984 Long term (current) use of oral hypoglycemic drugs: Secondary | ICD-10-CM | POA: Insufficient documentation

## 2016-10-31 DIAGNOSIS — Z7729 Contact with and (suspected ) exposure to other hazardous substances: Secondary | ICD-10-CM

## 2016-10-31 DIAGNOSIS — Z7982 Long term (current) use of aspirin: Secondary | ICD-10-CM | POA: Insufficient documentation

## 2016-10-31 DIAGNOSIS — R000 Tachycardia, unspecified: Secondary | ICD-10-CM | POA: Diagnosis present
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
## 2016-10-31 NOTE — ED Triage Notes (Signed)
Pt CO2 alarm was going off. PT denies any symptoms. He does have Lung Cancer and wife was concerned and wanted him to be checked out.

## 2016-10-31 NOTE — Discharge Instructions (Signed)
You were evaluated after carbon monoxide exposure and are not found to have carbon monoxide poisoning.  Return to the emergency room immediately for any headache, nausea, vomiting, confusion or altered mental status, sleepiness,trouble breathing, chest pain, or any other symptoms concerning to you.

## 2016-10-31 NOTE — ED Notes (Signed)
Pt VSS, NAD, in wheel chair with O2. Wife with pt. Pt PWD.

## 2016-10-31 NOTE — ED Notes (Signed)
Wife is here she forgot O2 tank had to go back home to get one.
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
## 2016-11-03 LAB — COOXEMETRY PANEL
Carboxyhemoglobin: 3.5 % — ABNORMAL HIGH (ref 0.5–1.5)
METHEMOGLOBIN: 0.7 % (ref 0.0–1.5)

## 2016-11-20 DEATH — deceased

## 2017-09-13 IMAGING — CT CT ANGIO CHEST
2 of 6 series · 17 of 46 positions shown · IV contrast (APPLIED)
Comparison: 12/24/2013

ADDENDUM:
Previous studies dated 04/08/2016 and 07/01/2016 are now available
for comparison.

The nodular changes seen in the right upper lobe on image number 41
of series 6 have regressed in the interval from the prior exam from
07/01/2016. The previously seen ground-glass nodule within the
posterior aspect of the right upper lobe is less well visualized on
the current exam. The large pleural based density in the left upper
lobe is stable in appearance from the prior exam. The scattered
nodular changes in the more inferior aspect of the residual left
lung are also stable from the recent exam. No new focal abnormality
is noted.
CLINICAL DATA: Shortness of Breath
EXAM:
CT ANGIOGRAPHY CHEST WITH CONTRAST
TECHNIQUE: Multidetector CT imaging of the chest was performed using the
standard protocol during bolus administration of intravenous
contrast. Multiplanar CT image reconstructions and MIPs were
obtained to evaluate the vascular anatomy.
CONTRAST:  75 mL Isovue 370.

[Series 5: thins · axial · 0.71mm/px · z∈[-320,-35]mm · 15 of 313 slices shown]
[im 14/313  lung]
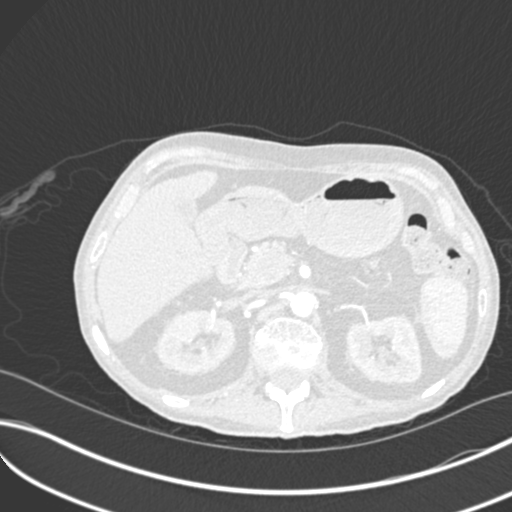
[im 41/313  soft-tissue]
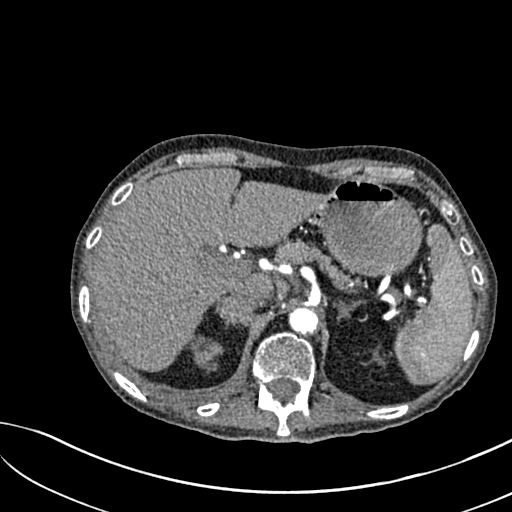
[im 55/313  lung]
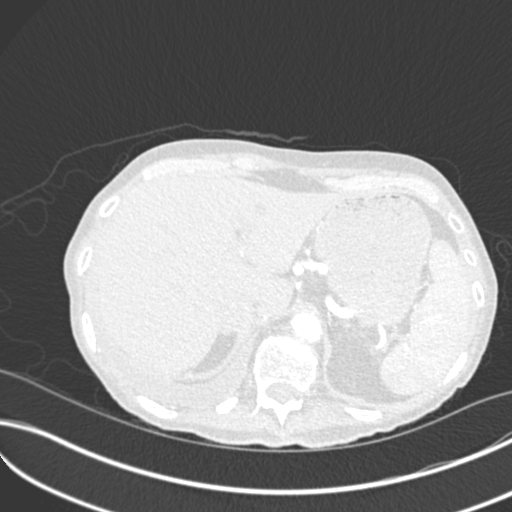
[im 82/313  soft-tissue]
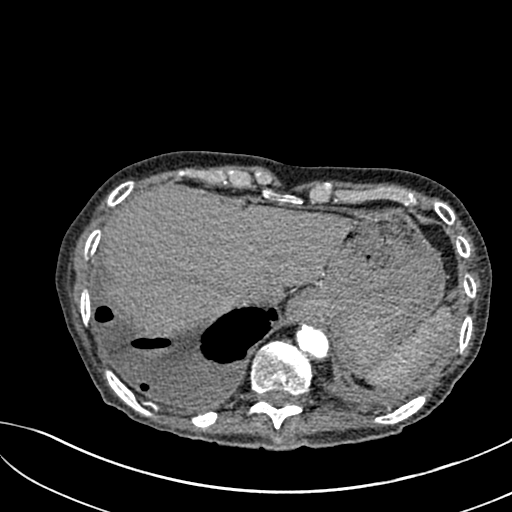
[im 95/313  lung]
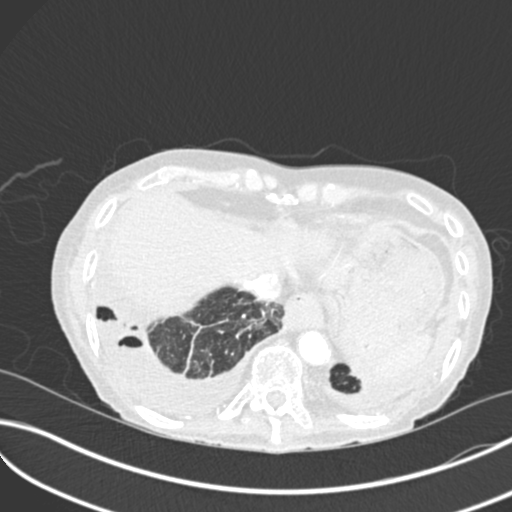
[im 123/313  soft-tissue]
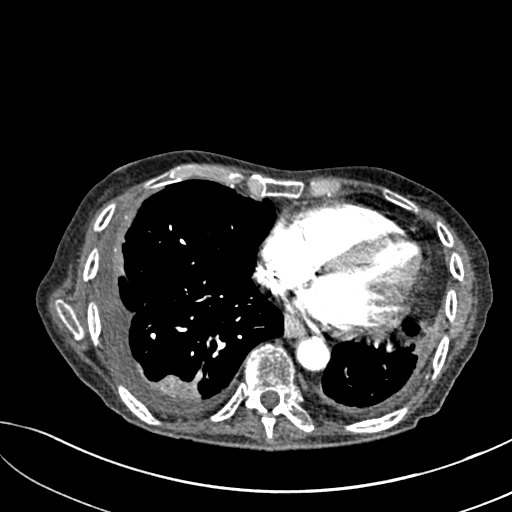
[im 136/313  lung]
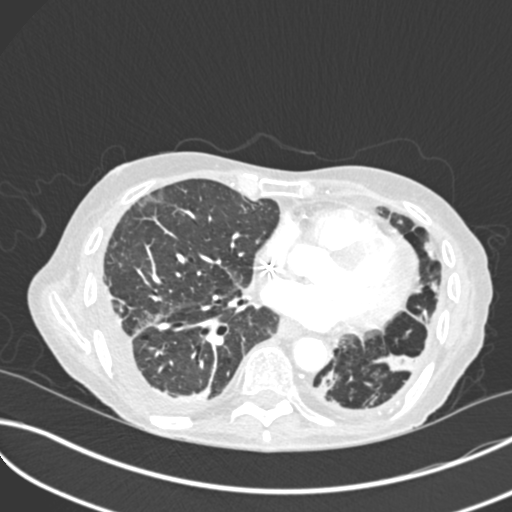
[im 163/313  soft-tissue]
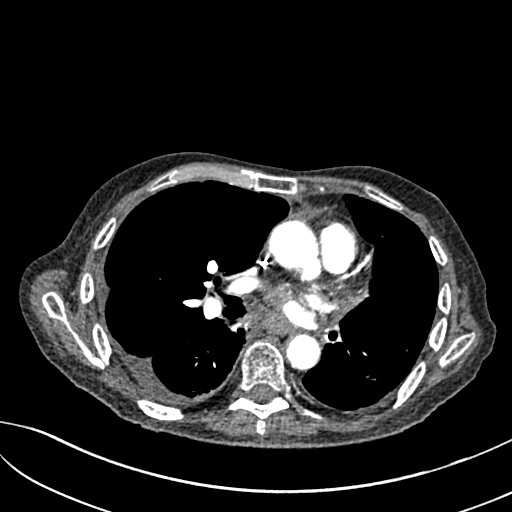
[im 177/313  lung]
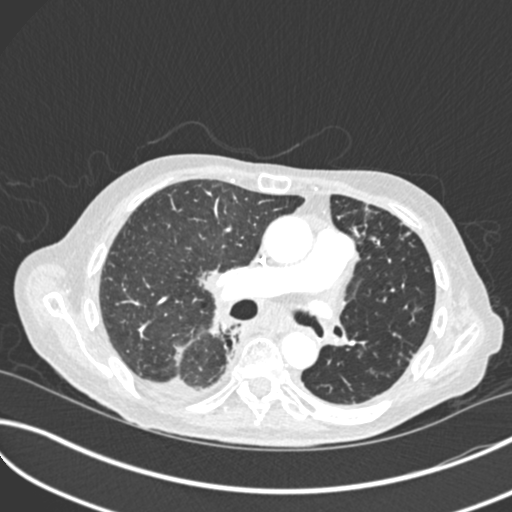
[im 190/313  soft-tissue]
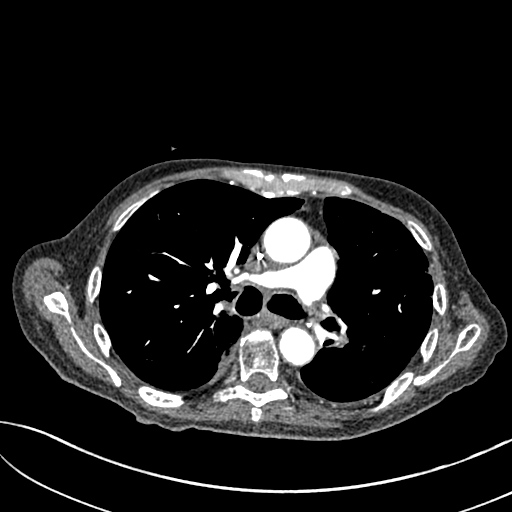
[im 218/313  lung]
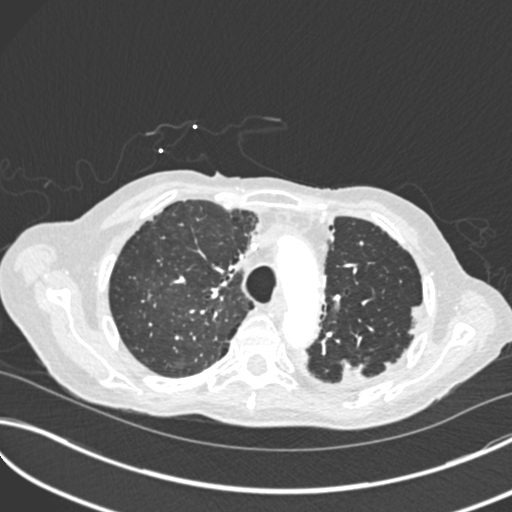
[im 231/313  soft-tissue]
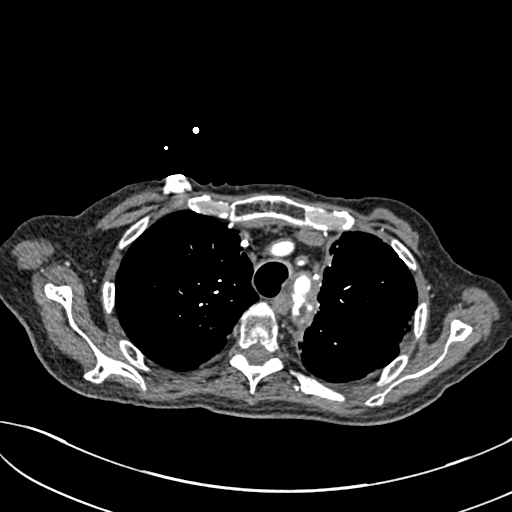
[im 258/313  lung]
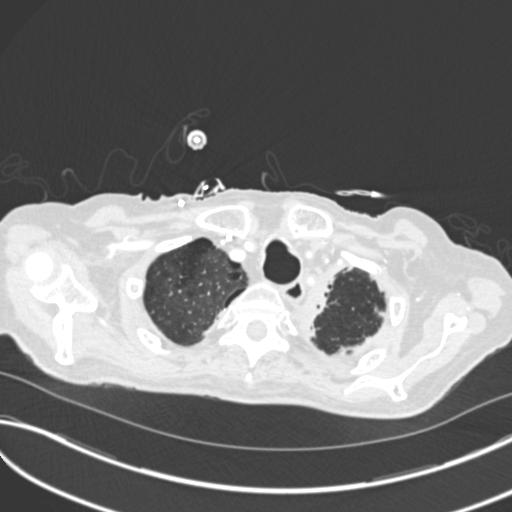
[im 272/313  soft-tissue]
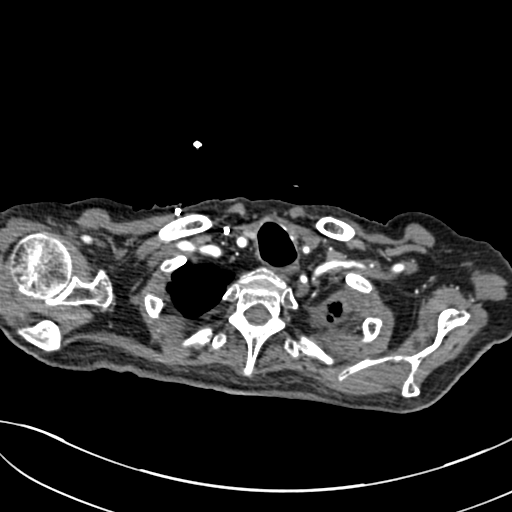
[im 299/313  lung]
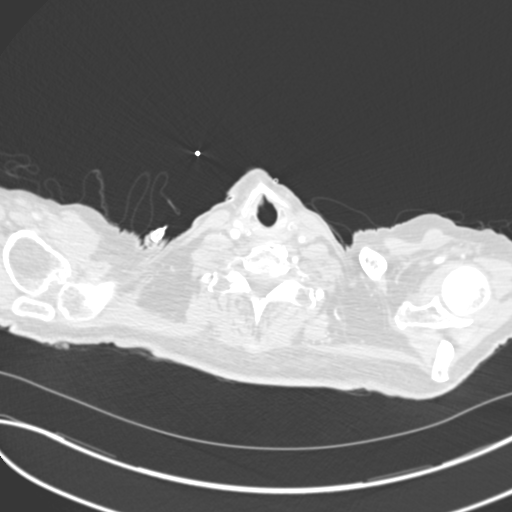

[Series 7: coronal mpr · coronal · 0.62mm/px · 2 of 70 slices shown]
[im 24/70  soft-tissue]
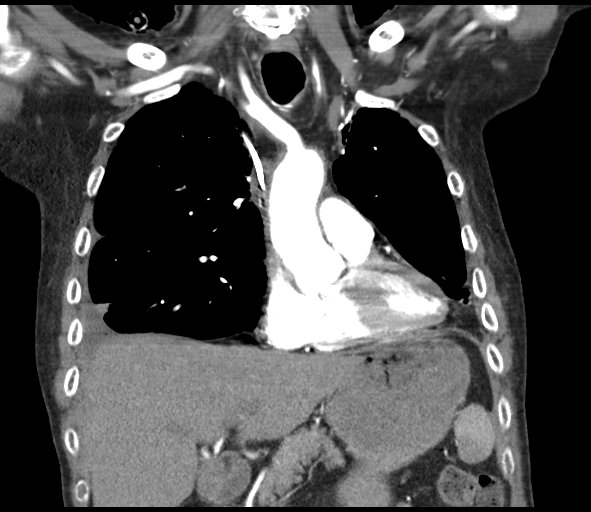
[im 47/70  soft-tissue]
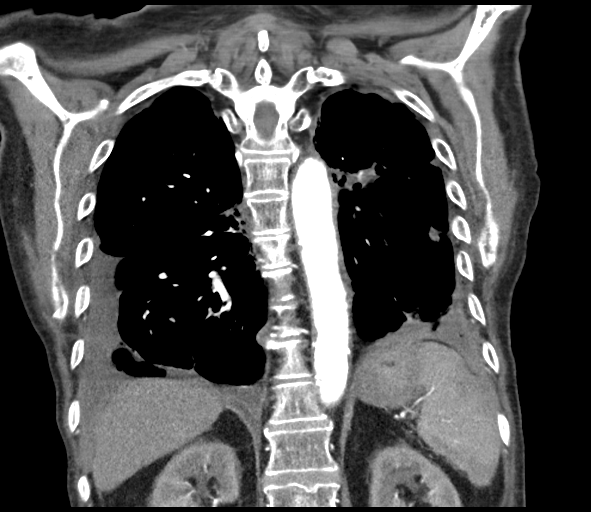

[17 of 46 positions shown; findings below may reference images not displayed]

FINDINGS: Cardiovascular: Thoracic aorta again demonstrates atherosclerotic
calcifications without aneurysmal dilatation or dissection. The
brachiocephalic vessels are well visualized. Multifocal stenoses of
the left common carotid artery are seen which have progressed in the
interval from the prior exam. At least one of these appears pre
occlusive. Pulmonary artery demonstrates postsurgical changes on the
left consistent with the known lobectomy. No filling defect to
suggest pulmonary embolism is seen. No significant cardiac
enlargement is noted. Coronary calcifications are seen.

Mediastinum/Nodes: The thoracic inlet is within normal limits. No
significant hilar or mediastinal adenopathy is noted. Some
thickening of the distal esophagus is seen of uncertain
significance. This was present on the prior exam and may be related
to reflux.

Lungs/Pleura: Diffuse emphysematous changes are seen. Scattered
nodular changes are noted in the right upper lobe best seen on image
number 41 of series 6 and in the middle lobe on image number 51 of
series 6. The largest of these measures approximately 9 mm and was
not present on the prior CT examination. Pleural based density on
the left in the upper lobe is noted which measures approximately 14
mm. Some scattered smaller nodules are noted throughout the residual
left lung. One of these measures approximately 8 mm best seen on
image number 49 of series 6. Scarring related to the prior lobectomy
is noted. Moderate size right-sided pleural effusion is noted with
air within. The areas likely related to the recent thoracentesis.
Right lower lobe atelectasis is seen.

Upper Abdomen: Visualized upper abdomen shows enlargement of the
right adrenal gland likely related to metastatic disease. This
measures approximately 3 cm in greatest dimension.

Musculoskeletal: Degenerative changes of the thoracic spine are
noted. No definitive acute bony abnormality is noted.

Review of the MIP images confirms the above findings.
IMPRESSION: Changes consistent with left lower lobectomy. There are some nodular
changes in the left upper lobe and residual portion of the left
lower lobe which may be postinflammatory in nature. Correlation with
recent CT would be helpful as these were not present on the prior
exam from 0945. If any more recent CTs could be made available they
could be compared.

Right adrenal lesion likely representing metastatic disease.

Changes consistent with recent right-sided thoracentesis with air
within the pleural fluid.

Nodular changes on the right which would also be helpful to compare
with more recent exams.

No evidence of pulmonary embolism.

Aortic Atherosclerosis ([id number].K) and Emphysema ([id number].C).
